# Patient Record
Sex: Female | Born: 1980 | Race: Black or African American | Hispanic: No | Marital: Single | Smoking: Never smoker
Health system: Southern US, Community
[De-identification: ages and names within clinical notes are randomized; demographics above are authoritative.]

## PROBLEM LIST (undated history)

## (undated) DIAGNOSIS — S7410XA Injury of femoral nerve at hip and thigh level, unspecified leg, initial encounter: Secondary | ICD-10-CM

## (undated) DIAGNOSIS — K649 Unspecified hemorrhoids: Secondary | ICD-10-CM

## (undated) DIAGNOSIS — N809 Endometriosis, unspecified: Secondary | ICD-10-CM

## (undated) DIAGNOSIS — K219 Gastro-esophageal reflux disease without esophagitis: Secondary | ICD-10-CM

## (undated) DIAGNOSIS — Z90710 Acquired absence of both cervix and uterus: Secondary | ICD-10-CM

## (undated) DIAGNOSIS — L680 Hirsutism: Secondary | ICD-10-CM

## (undated) DIAGNOSIS — F419 Anxiety disorder, unspecified: Secondary | ICD-10-CM

## (undated) DIAGNOSIS — Z8742 Personal history of other diseases of the female genital tract: Secondary | ICD-10-CM

## (undated) DIAGNOSIS — N319 Neuromuscular dysfunction of bladder, unspecified: Secondary | ICD-10-CM

## (undated) DIAGNOSIS — N644 Mastodynia: Secondary | ICD-10-CM

## (undated) DIAGNOSIS — L709 Acne, unspecified: Secondary | ICD-10-CM

## (undated) HISTORY — DX: Neuromuscular dysfunction of bladder, unspecified: N31.9

## (undated) HISTORY — DX: Endometriosis, unspecified: N80.9

## (undated) HISTORY — DX: Acne, unspecified: L70.9

## (undated) HISTORY — DX: Anxiety disorder, unspecified: F41.9

## (undated) HISTORY — PX: CYST EXCISION: SHX5701

## (undated) HISTORY — DX: Injury of femoral nerve at hip and thigh level, unspecified leg, initial encounter: S74.10XA

## (undated) HISTORY — DX: Unspecified hemorrhoids: K64.9

## (undated) HISTORY — PX: OTHER SURGICAL HISTORY: SHX169

## (undated) HISTORY — DX: Acquired absence of both cervix and uterus: Z90.710

## (undated) HISTORY — DX: Gastro-esophageal reflux disease without esophagitis: K21.9

## (undated) HISTORY — DX: Personal history of other diseases of the female genital tract: Z87.42

## (undated) HISTORY — PX: LIPOSUCTION: SHX10

## (undated) HISTORY — DX: Hirsutism: L68.0

## (undated) HISTORY — DX: Mastodynia: N64.4

---

## 2003-12-30 HISTORY — PX: OTHER SURGICAL HISTORY: SHX169

## 2004-01-19 ENCOUNTER — Other Ambulatory Visit: Payer: Self-pay

## 2007-10-18 ENCOUNTER — Observation Stay: Payer: Self-pay | Admitting: Obstetrics and Gynecology

## 2007-12-29 ENCOUNTER — Observation Stay: Payer: Self-pay

## 2008-01-06 ENCOUNTER — Inpatient Hospital Stay: Payer: Self-pay

## 2008-10-15 ENCOUNTER — Emergency Department: Payer: Self-pay | Admitting: Emergency Medicine

## 2008-10-19 ENCOUNTER — Emergency Department: Payer: Self-pay | Admitting: Emergency Medicine

## 2009-02-16 ENCOUNTER — Ambulatory Visit: Payer: Self-pay | Admitting: Internal Medicine

## 2010-03-05 ENCOUNTER — Ambulatory Visit: Payer: Self-pay | Admitting: Obstetrics and Gynecology

## 2010-03-08 ENCOUNTER — Ambulatory Visit: Payer: Self-pay | Admitting: Obstetrics and Gynecology

## 2010-10-04 ENCOUNTER — Encounter: Admission: RE | Admit: 2010-10-04 | Discharge: 2010-10-04 | Payer: Self-pay | Admitting: Unknown Physician Specialty

## 2011-10-30 HISTORY — PX: LAPAROSCOPY: SHX197

## 2011-11-05 ENCOUNTER — Ambulatory Visit: Payer: Self-pay | Admitting: Obstetrics and Gynecology

## 2011-11-10 ENCOUNTER — Ambulatory Visit: Payer: Self-pay | Admitting: Obstetrics and Gynecology

## 2011-11-13 LAB — PATHOLOGY REPORT

## 2011-12-30 DIAGNOSIS — S7410XA Injury of femoral nerve at hip and thigh level, unspecified leg, initial encounter: Secondary | ICD-10-CM

## 2011-12-30 HISTORY — DX: Injury of femoral nerve at hip and thigh level, unspecified leg, initial encounter: S74.10XA

## 2012-02-17 ENCOUNTER — Ambulatory Visit: Payer: Self-pay | Admitting: Obstetrics and Gynecology

## 2012-02-17 LAB — CBC
HCT: 37.7 % (ref 35.0–47.0)
MCH: 29.9 pg (ref 26.0–34.0)
MCV: 91 fL (ref 80–100)
RBC: 4.16 10*6/uL (ref 3.80–5.20)
WBC: 7.5 10*3/uL (ref 3.6–11.0)

## 2012-02-23 ENCOUNTER — Ambulatory Visit: Payer: Self-pay | Admitting: Obstetrics and Gynecology

## 2012-02-26 LAB — PATHOLOGY REPORT

## 2012-10-18 ENCOUNTER — Ambulatory Visit: Payer: Self-pay | Admitting: Gastroenterology

## 2013-07-09 ENCOUNTER — Emergency Department: Payer: Self-pay | Admitting: Emergency Medicine

## 2014-03-14 LAB — HM PAP SMEAR: HM Pap smear: NEGATIVE

## 2014-11-04 ENCOUNTER — Ambulatory Visit: Payer: Self-pay | Admitting: Gastroenterology

## 2014-11-04 LAB — CLOSTRIDIUM DIFFICILE(ARMC)

## 2014-11-07 LAB — STOOL CULTURE

## 2015-04-16 ENCOUNTER — Ambulatory Visit
Admit: 2015-04-16 | Disposition: A | Discharge status: Home / Self Care | Payer: Self-pay | Attending: Physician Assistant | Admitting: Physician Assistant

## 2015-04-22 NOTE — Consult Note (Signed)
PATIENT NAME:  Tammy Lee, Tammy Lee MR#:  [id number] DATE OF BIRTH:  06-16-1981  DATE OF CONSULTATION:  02/24/2012  REFERRING PHYSICIAN:  Dr. Greggory KeeneFrancesco  CONSULTING PHYSICIAN:  Rose PhiPeter R. Kemper Durielarke, MD  HISTORY: Ms. Tammy Lee is a 34 year old right-handed African American woman with history of endometriosis and chronic pelvic pain who is referred for evaluation of lower extremity numbness and weakness following transvaginal hysterectomy. History comes from the patient, conversation with Dr. Greggory KeeneFrancesco, and her hospital records.   The patient underwent surgery under general endotracheal anesthesia without complication on 02/23/2012. The patient does not recall the period of being in the recovery area, reports feeling  in hospital bed after the procedure, drowsy and dozing for about three hours before awakened by a nurse who came in to give medications through her IV for pain. She reports feeling nauseated and having two bouts of emesis.   After midnight, she rang the nurse to go to the bathroom to urinate. While the nurse was preparing things in the bathroom, the patient sat at the side of the bed and started to stand up, but fell as her legs gave out from weakness, hitting the right knee on the floor and holding to the bed rail until the nurses lifted her onto the bed. She found that she had decreased feeling and numbness and tight feeling of bilateral lower extremities of anterior medial thighs, weakness of her legs with the exception that she had normal feeling and strength in her feet and toes.   Seen the evening of 02/24/2012, she reported that she had had some improvement of movement of the right leg and some improvement of sensation, still had a feeling of numbness of bilateral anterior medial thighs and calves. She denies any prior similar problems. She has not yet had a bowel movement or urge to have a bowel movement.  The patient reports normal urge to urinate and normal urination, but that when sitting on  the commode, her flow would come somewhat in spurts.   PHYSICAL EXAMINATION: The patient is a well developed and well nourished [place] woman who was examined lying initially semisupine, in no apparent distress. She was normocephalic without evidence of trauma and her neck was supple. Mental status was normal as was cranial nerve examination. Motor examination of the upper extremities showed normal tone and symmetric normal strength throughout.   In the lower extremities, there was very mild weakness of hip flexion rated 4+ to 5-/5 bilaterally. Knee extension was rated 4+/5 for the right and  3-/5 on the left. Strength of knees abduction and adduction, knee flexion, foot dorsiflexion and plantarflexion was normal bilaterally. On sensory examination, she reported relative decrease of cold and light touch of inferomedial thighs bilaterally, more so on the left than right. Reflexes were symmetric and rated 2+ in the upper extremities. Reflexes were 2+ bilaterally at the ankles. Patellar reflex 1+ on the right and trace to 1+ on the left. When asked to stand at the side of the bed with balancing assistance, she was able to bear weight, but was not stable to walk.   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   RECOMMENDATIONS:  1. She is referred to physical therapy.  2. Recommend holding at this time on imaging of the pelvis and on electrical studies. I will check on her progress tomorrow.   I appreciate being asked to see this pleasant and interesting lady.  ____________________________ Rose Phi. Kemper Durie, MD prc:bjt D: 02/25/2012 08:54:47 ET T: 02/25/2012 09:44:25 ET JOB#: [id number]  cc: Rose Phi. Kemper Durie, MD, <Dictator> Gaspar Garbe MD ELECTRONICALLY SIGNED 02/26/2012 18:00

## 2015-04-22 NOTE — H&P (Signed)
PATIENT NAME:  Tammy Lee, Tammy Lee MR#:  [id number] DATE OF BIRTH:  25-May-1981  DATE OF ADMISSION:  02/23/2012  PREOPERATIVE DIAGNOSIS: Symptomatic endometriosis.  HISTORY:  Tammy Lee is a 34 year old African American female, para 2-0-1-2, on Microgestin with iron 120 continuously and then 84/7 regimen, presents for surgical management of symptomatic endometriosis. The patient has chronic pain syndrome with perimenstrual low backache, severe dysmenorrhea, and deep thrust dyspareunia. Laparoscopy in November 2012 revealed endometriosis with yellow lesions along the bladder, the anterior abdominal wall, as well as the left uterosacral ligament and left ovarian fossa. The right ovary had single isolated powder-burn implant. The patient desires definitive treatment at this time.   PAST MEDICAL HISTORY:  1. Anxiety. 2. Ovarian cyst.  3. History of ectopic pregnancy, status post left salpingectomy.   PAST SURGICAL HISTORY:  1. Operative laparoscopy with left salpingectomy 2005.  2. Bilateral tubal cautery in 2011. 3. Neck cyst removal in 2000.  4. Laparoscopy with peritoneal biopsies in November 2012.   PAST OB HISTORY: Para 2-0-1-2, spontaneous vaginal delivery times two. Excision of ectopic times one.   FAMILY HISTORY: Negative for cancer of the colon or ovary. There was a history of breast cancer in maternal great grandmother. There was history of heart disease in maternal grandfather. There is a history of diabetes mellitus in maternal grandmother. No history of endometriosis.   SOCIAL HISTORY: The patient does not smoke, does drink alcohol socially. She denies drug use. 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    CURRENT MEDICATIONS:  1. Loestrin-FE 120 daily.  2. Multivitamin daily.  3. Diflucan p.r.n.   DRUG ALLERGIES: None.   REVIEW OF SYSTEMS: The patient denies recent illness. She denies history of coagulopathy. She denies history of reactive airway  disease.   PHYSICAL EXAMINATION:  VITAL SIGNS: Height 5 feet, 2 inches, weight 148, BMI 28, blood pressure 115/74, heart rate 87.  GENERAL:  The patient is a pleasant and well-appearing African American female in no acute distress. She is alert and oriented.   HEENT:  Oropharynx is clear.   NECK: Supple. There is no thyromegaly or adenopathy.   LUNGS: Clear.   HEART: Regular rate and rhythm without murmur.   ABDOMEN: Soft, nontender. No organomegaly.  Laparoscopy incisions are well approximated and healed. No hernias.    PELVIC: Normal external genitalia. BUS normal. Vagina has good estrogen effect. Cervix is parous and slightly barrel-shaped. There is mild cervical motion tenderness. Uterus is retroverted, mobile, and tender, two out of four. No adnexal masses or tenderness are appreciated. It is not enlarged.   RECTAL: External rectal exam is normal.   EXTREMITIES: Without clubbing, cyanosis, or edema.   SKIN: Normal  MUSCULOSKELETAL:  Normal.  NEUROLOGIC: Normal.   IMPRESSION:  Symptomatic endometriosis.   PLAN: Transvaginal hysterectomy. Date of surgery is 02/23/2012.   CONSENT NOTE: Tammy EvensCandace Lee is to undergo TVH for symptomatic endometriosis. She is understanding of the planned procedures and is aware of and accepting of all surgical risks which include but are not limited to bleeding, infection, pelvic organ injury with need for repair, blood clot disorders, anesthesia risks, and death. The patient has had all questions answered. Consent is given.     ____________________________ Prentice DockerMartin A. Javaris Wigington, MD mad:bjt D: 02/17/2012 09:26:08 ET T: 02/17/2012 09:49:12 ET JOB#: [id number]  cc: Daphine DeutscherMartin A. [place], MD, <Dictator> Prentice DockerMARTIN A Kamyia Thomason MD ELECTRONICALLY SIGNED 02/23/2012 16:03

## 2015-04-22 NOTE — Op Note (Signed)
PATIENT NAME:  Tammy Lee, Tammy Lee DATE OF BIRTH:  1981/04/13  DATE OF PROCEDURE:  02/23/2012  PREOPERATIVE DIAGNOSES:  1. Chronic pelvic pain.  2. Endometriosis.   POSTOPERATIVE DIAGNOSES: 1. Chronic pelvic pain. 2. Endometriosis.  OPERATIVE PROCEDURE: Transvaginal hysterectomy.   SURGEON: Herold HarmsMartin A Elfie Costanza, M.D.   FIRST ASSISTANT:  Dr. Cassell ClementLynde Knowles-Jonas   ANESTHESIA: General endotracheal.   INDICATIONS: The patient is a 34 year old African American female, para 2-0-1-2, on Microgestin with iron 120 continuously, who presents for surgical management of symptomatic endometriosis. The patient has chronic pain syndrome with perimenstrual low backache, severe dysmenorrhea, and deep thrust dyspareunia. Laparoscopy in 2012 in November revealed endometriosis throughout the bladder flap, anterior abdominal wall, uterosacral ligaments, and left ovarian fossa.   FINDINGS AT SURGERY: A gynecoid pelvis. The uterus was midplane and of normal size and shape. It was grossly normal. The right ovary demonstrated a 3-cm simple cyst.  The left ovary was normal.   DESCRIPTION OF PROCEDURE: The patient was brought to the operating room where she was placed in the supine position. General endotracheal anesthesia was induced without difficulty. She was placed in the dorsal lithotomy position using candy-cane stirrups. A Betadine perineal intravaginal prep and drape was performed in the standard fashion. A Foley catheter was placed and was draining clear yellow urine from the bladder. A weighted speculum was placed into the vagina. A double-tooth tenaculum was placed on the cervix. Posterior colpotomy was made with Mayo scissors. The uterosacral ligaments were clamped, cut, and stick tied using 0 Vicryl. The cervix was circumscribed with a scalpel and the bladder flap was dissected off the lower uterine segment through sharp and blunt dissection. Sequentially the cardinal/broad ligament complexes  were clamped, cut, and stick tied up to the level of the uteroovarian ligaments. At this point each pedicle was crossclamped and cut and the uterus was removed from the operative field. These pedicles were tied off using a suture ligature technique. Good hemostasis was noted. The ovaries were noted to be normal with a simple ovarian cyst of 3 cm being noted on the right. The posterior cuff was closed using a running locking stitch of 0 chromic. The vagina was then closed with simple interrupted sutures of 2-0 chromic. Good hemostasis was noted. Upon completion of the procedure, all instrumentation was removed from the vagina. The patient was then mobilized, awakened, and taken to the recovery room in satisfactory condition.  Estimated blood loss was 150 mL. IV fluids were 1400 mL. Urine output was clear, not calculated.     ____________________________ Prentice DockerMartin A. Efraim Vanallen, MD mad:bjt D: 02/23/2012 14:22:42 ET T: 02/23/2012 14:46:07 ET JOB#: [id number]  cc: Daphine DeutscherMartin A. Westlynn Fifer, MD, <Dictator> Beryle QuantLynde G. Toya SmothersKnowles-Jonas, MD Prentice DockerMARTIN A Rice Walsh MD ELECTRONICALLY SIGNED 02/23/2012 16:05

## 2015-04-26 ENCOUNTER — Other Ambulatory Visit: Payer: Self-pay | Admitting: Neurology

## 2015-04-26 DIAGNOSIS — R42 Dizziness and giddiness: Secondary | ICD-10-CM

## 2015-05-03 ENCOUNTER — Ambulatory Visit: Payer: Self-pay

## 2015-05-03 ENCOUNTER — Ambulatory Visit
Admission: RE | Admit: 2015-05-03 | Discharge: 2015-05-03 | Disposition: A | Discharge status: Home / Self Care | Payer: BLUE CROSS/BLUE SHIELD | Source: Ambulatory Visit | Attending: Neurology | Admitting: Neurology

## 2015-05-03 DIAGNOSIS — R42 Dizziness and giddiness: Secondary | ICD-10-CM | POA: Insufficient documentation

## 2015-05-03 DIAGNOSIS — R55 Syncope and collapse: Secondary | ICD-10-CM | POA: Insufficient documentation

## 2015-05-03 MED ORDER — GADOBENATE DIMEGLUMINE 529 MG/ML IV SOLN
15.0000 mL | Freq: Once | INTRAVENOUS | Status: AC | PRN
  Filled 2015-05-03: qty 15

## 2015-05-24 DIAGNOSIS — R519 Headache, unspecified: Secondary | ICD-10-CM | POA: Insufficient documentation

## 2015-05-24 DIAGNOSIS — R51 Headache: Secondary | ICD-10-CM

## 2015-05-26 DIAGNOSIS — G43809 Other migraine, not intractable, without status migrainosus: Secondary | ICD-10-CM | POA: Insufficient documentation

## 2015-10-17 ENCOUNTER — Telehealth: Payer: Self-pay | Admitting: Obstetrics and Gynecology

## 2015-10-17 NOTE — Telephone Encounter (Signed)
PT CALLED AND WAS TOLD THAT SHE HAS HSV AND SHE HASN'T HAD ANY SYMPTOMS, SO SHE WANTED TO KNOW IF SOMETHING COULD BE CALLED IN FOR HER, CAN YOU SEND RX TO [facility]. SHE WOULD LIKE A CALL BACK.

## 2015-10-17 NOTE — Telephone Encounter (Signed)
Pt states she has a bump on the vagina and thinks its hsv. Advised appt- appt made for 10/20- 7:30.

## 2015-10-18 ENCOUNTER — Encounter: Payer: Self-pay | Admitting: Obstetrics and Gynecology

## 2015-10-18 ENCOUNTER — Ambulatory Visit (INDEPENDENT_AMBULATORY_CARE_PROVIDER_SITE_OTHER): Payer: BLUE CROSS/BLUE SHIELD | Admitting: Obstetrics and Gynecology

## 2015-10-18 VITALS — BP 127/90 | HR 76 | Ht 63.0 in | Wt 158.0 lb

## 2015-10-18 DIAGNOSIS — N83209 Unspecified ovarian cyst, unspecified side: Secondary | ICD-10-CM | POA: Insufficient documentation

## 2015-10-18 DIAGNOSIS — Z8759 Personal history of other complications of pregnancy, childbirth and the puerperium: Secondary | ICD-10-CM | POA: Insufficient documentation

## 2015-10-18 DIAGNOSIS — N809 Endometriosis, unspecified: Secondary | ICD-10-CM

## 2015-10-18 DIAGNOSIS — L680 Hirsutism: Secondary | ICD-10-CM

## 2015-10-18 DIAGNOSIS — F419 Anxiety disorder, unspecified: Secondary | ICD-10-CM | POA: Insufficient documentation

## 2015-10-18 DIAGNOSIS — K219 Gastro-esophageal reflux disease without esophagitis: Secondary | ICD-10-CM

## 2015-10-18 MED ORDER — ACYCLOVIR 400 MG PO TABS: 400.0000 mg | ORAL_TABLET | Freq: Three times a day (TID) | ORAL | Status: DC

## 2015-10-18 NOTE — Patient Instructions (Signed)
1.  Acyclovir 400 mg 3 times a day for 5 days.  6.  Refills given. 2.  Follow up as needed. 3.  Discontinue Flagyl and oral supplements at this time while taking Benadryl 25-50 mg every 6 hours.  Over the next 24 hours

## 2015-10-18 NOTE — Progress Notes (Signed)
Patient ID: Tammy Lee, female   DOB: Apr 06, 1981, 34 y.o.   MRN: [id number] Pt presents for bump on vulva. Also c/o allergic reaction to medication, unsure exactly which one. Was put on Flagyl, Metrozol for BV. Lip swelling, neck itching. Taking Benadryl x2 with results this am. Also taking suppl of oregano.  Chief complaint: 1.  Vulvitis symptoms. 2.  History of HSV-2 positive.  Patient presents today for evaluation of possible acute onset of HSV-2.  Patient states that she has had itching, burning and discomfort in the vulvar region and felt A" bump" at the introitus.  She has not had any obvious ulceration. Patient has been treated for BV with Flagyl and has developed a probable allergic reaction with swelling.  She has taken Benadryl for this condition.  Past Medical History  Diagnosis Date  . Bladder dysfunction   . GERD (gastroesophageal reflux disease)   . Hemorrhoid   . Endometriosis   . Acne   . Anxiety   . History of ovarian cyst   . 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 Mastodynia   . Status post vaginal hysterectomy   . Hirsutism    Past Surgical History  Procedure Laterality Date  . Tvh      02/23/2012  . Bilateral tubal catery    . Laparoscopy  10/2011    WITH EXCISION AND FULGURATION OF ENDOMETRIOSIS  . Operative l/s with left salpingectomy  2005    Ectopic    Review of systems: Per HPI.  OBJECTIVE: BP 127/90 mmHg  Pulse 76  Ht 5\' 3"  (1.6 m)  Wt 158 lb (71.668 kg)  BMI 28.00 kg/m2 Postoperative hernia from her female no acute distress.  HEENT exam lower lip swelling noted. Lungs-no stridor, regular rate  Pelvic: External genitalia normal; mild sensitivity to palpation of the introitus and posterior fourchette without obvious ulceration, epithelial skin breakdown, or discharge.  No malodor. BUS-normal  IMPRESSION: 1.  HSV-2 prodromal symptoms and patient with history of positive HSV 2 antibody .  PLAN: 1.  Acyclovir 400 mg 3 times a day  for 5 days. Refill 6. 2.  Return when necessary  A total of 15 minutes were spent face-to-face with the patient during this encounter and over half of that time dealt with counseling and coordination of care.  Herold HarmsMartin A Defrancesco, MD  Note: This dictation was prepared with Dragon dictation along with smaller phrase technology. Any transcriptional errors that result from this process are unintentional.

## 2016-03-25 ENCOUNTER — Ambulatory Visit (INDEPENDENT_AMBULATORY_CARE_PROVIDER_SITE_OTHER): Payer: Managed Care, Other (non HMO) | Admitting: Obstetrics and Gynecology

## 2016-03-25 ENCOUNTER — Encounter: Payer: Self-pay | Admitting: Obstetrics and Gynecology

## 2016-03-25 VITALS — BP 115/73 | HR 80 | Ht 63.0 in | Wt 162.6 lb

## 2016-03-25 DIAGNOSIS — Z0000 Encounter for general adult medical examination without abnormal findings: Secondary | ICD-10-CM | POA: Diagnosis not present

## 2016-03-25 DIAGNOSIS — Z202 Contact with and (suspected) exposure to infections with a predominantly sexual mode of transmission: Secondary | ICD-10-CM | POA: Diagnosis not present

## 2016-03-25 DIAGNOSIS — Z90710 Acquired absence of both cervix and uterus: Secondary | ICD-10-CM | POA: Insufficient documentation

## 2016-03-25 DIAGNOSIS — Z01419 Encounter for gynecological examination (general) (routine) without abnormal findings: Secondary | ICD-10-CM

## 2016-03-25 DIAGNOSIS — N809 Endometriosis, unspecified: Secondary | ICD-10-CM

## 2016-03-25 NOTE — Patient Instructions (Signed)
1. No Pap smear needed. 2. Continue self breast exam 3. Maintain healthy eating and exercise with a goal of 7 pound weight loss over the year 4. Consider Depo-Provera injection as a treatment for symptomatic endometriosis. Other options were discussed and not desired (Depo-Lupron, danazol) 5. Return in 1 year for annual exam

## 2016-03-25 NOTE — Progress Notes (Signed)
Patient ID: Tammy Lee, female   DOB: [birth year], 35 y.o.   MRN: [id number] ANNUAL PREVENTATIVE CARE GYN  ENCOUNTER NOTE  Subjective:       Tammy Lee is a 35 y.o. No obstetric history on file. female here for a routine annual gynecologic exam.  Current complaints: 1. Std testing  2. Endometriosis  Endometriosis pain in right lower quadrant/pelvis is stable. Dyspareunia, present, without major changes Patient reports that her femoral nerve neuropraxia is stable. Bowel and bladder function are normal.   Gynecologic History No LMP recorded. Patient has had a hysterectomy. Contraception: status post hysterectomy Last Pap: 02/2014 neg/neg. Results were: normal Last mammogram: n/a. Results were: n/a  Obstetric History OB History  No data available    Past Medical History  Diagnosis Date  . Bladder dysfunction   . GERD (gastroesophageal reflux disease)   . Hemorrhoid   . Endometriosis   . Acne   . Anxiety   . History of ovarian cyst   . 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 Mastodynia   . Status post vaginal hysterectomy   . Hirsutism     Past Surgical History  Procedure Laterality Date  . Tvh      02/23/2012  . Bilateral tubal catery    . Laparoscopy  10/2011    WITH EXCISION AND FULGURATION OF ENDOMETRIOSIS  . Operative l/s with left salpingectomy  2005    Ectopic    Current Outpatient Prescriptions on File Prior to Visit  Medication Sig Dispense Refill  . acyclovir (ZOVIRAX) 400 MG tablet Take 1 tablet (400 mg total) by mouth 3 (three) times daily. 15 tablet 6  . omeprazole (PRILOSEC) 20 MG capsule Take 20 mg by mouth daily.     No current facility-administered medications on file prior to visit.    Allergies  Allergen Reactions  . Citalopram Other (See Comments)  . Topiramate Other (See Comments)    sleepy  . Augmentin [Amoxicillin-Pot Clavulanate] Rash    Social History   Social History  . Marital Status: Single    Spouse Name: N/A   . Number of Children: N/A  . Years of Education: N/A   Occupational History  . Not on file.   Social History Main Topics  . Smoking status: Never Smoker   . Smokeless tobacco: Not on file  . Alcohol Use: Yes     Comment: occas  . Drug Use: No  . Sexual Activity: Yes    Birth Control/ Protection: Surgical     Comment: tvh   Other Topics Concern  . Not on file   Social History Narrative    Family History  Problem Relation Age of Onset  . Cancer Neg Hx   . Diabetes Neg Hx   . Heart disease Neg Hx     The following portions of the patient's history were reviewed and updated as appropriate: allergies, current medications, past family history, past medical history, past social history, past surgical history and problem list.  Review of Systems ROS Review of Systems - General ROS: negative for - chills, fatigue, fever, hot flashes, night sweats, weight gain or weight loss Psychological ROS: negative for - anxiety, decreased libido, depression, mood swings, physical abuse or sexual abuse Ophthalmic ROS: negative for - blurry vision, eye pain or loss of vision ENT ROS: negative for - headaches, hearing change, visual changes or vocal changes Allergy and Immunology ROS: negative for - hives, itchy/watery eyes or seasonal allergies Hematological  and Lymphatic ROS: negative for - bleeding problems, bruising, swollen lymph nodes or weight loss Endocrine ROS: negative for - galactorrhea, hair pattern changes, hot flashes, malaise/lethargy, mood swings, palpitations, polydipsia/polyuria, skin changes, temperature intolerance or unexpected weight changes Breast ROS: negative for - new or changing breast lumps or nipple discharge Respiratory ROS: negative for - cough or shortness of breath Cardiovascular ROS: negative for - chest pain, irregular heartbeat, palpitations or shortness of breath Gastrointestinal ROS: no abdominal pain, change in bowel habits, or black or bloody  stools Genito-Urinary ROS: no dysuria, trouble voiding, or hematuria Musculoskeletal ROS: negative for - joint pain or joint stiffness Neurological ROS: negative for - bowel and bladder control changes Dermatological ROS: negative for rash and skin lesion changes   Objective:   BP 115/73 mmHg  Pulse 80  Ht 5\' 3"  (1.6 m)  Wt 162 lb 9.6 oz (73.755 kg)  BMI 28.81 kg/m2 CONSTITUTIONAL: Well-developed, well-nourished female in no acute distress.  PSYCHIATRIC: Normal mood and affect. Normal behavior. Normal judgment and thought content. NEUROLGIC: Alert and oriented to person, place, and time. Normal muscle tone coordination. No cranial nerve deficit noted. HENT:  Normocephalic, atraumatic, External right and left ear normal. Oropharynx is clear and moist EYES: Conjunctivae and EOM are normal. Pupils are equal, round, and reactive to light. No scleral icterus.  NECK: Normal range of motion, supple, no masses.  Normal thyroid.  SKIN: Skin is warm and dry. No rash noted. Not diaphoretic. No erythema. No pallor. CARDIOVASCULAR: Normal heart rate noted, regular rhythm, no murmur. RESPIRATORY: Clear to auscultation bilaterally. Effort and breath sounds normal, no problems with respiration noted. BREASTS: Symmetric in size. No masses, skin changes, nipple drainage, or lymphadenopathy. ABDOMEN: Soft, normal bowel sounds, no distention noted.  No tenderness, rebound or guarding.  BLADDER: Normal PELVIC:  External Genitalia: Normal  BUS: Normal  Vagina: Normal; good vault support; cuff intact without nodularity or tenderness  Cervix: Surgically absent  Uterus: Surgically absent  Adnexa: Normal; nonpalpable and nontender  RV: External Exam NormaI  MUSCULOSKELETAL: Normal range of motion. No tenderness.  No cyanosis, clubbing, or edema.  2+ distal pulses. LYMPHATIC: No Axillary, Supraclavicular, or Inguinal Adenopathy.    Assessment:   Annual gynecologic examination 35 y.o. Contraception:  status post hysterectomy TVH bmi-28 Endometriosis, stable Femoral nerve neuropraxia stable  Plan:  Pap: GC/CT NAAT Mammogram: Not Indicated Stool Guaiac Testing:  Not Indicated Labs: std testing - routine labs thru pcp Routine preventative health maintenance measures emphasized: Exercise/Diet/Weight control, Tobacco Warnings and Alcohol/Substance use risks Consider Depo-Provera as the treatment for endometriosis if desired Return to Clinic - 1 Year   [place] Land'OrMiller, CMA  Herold HarmsMartin A Gini Caputo, MD  Note: This dictation was prepared with Dragon dictation along with smaller phrase technology. Any transcriptional errors that result from this process are unintentional.

## 2016-03-26 LAB — GC/CHLAMYDIA PROBE AMP
Chlamydia trachomatis, NAA: NEGATIVE
Neisseria gonorrhoeae by PCR: NEGATIVE

## 2016-03-26 LAB — HEPATITIS B SURFACE ANTIGEN: Hepatitis B Surface Ag: NEGATIVE

## 2016-03-26 LAB — HIV ANTIBODY (ROUTINE TESTING W REFLEX): HIV Screen 4th Generation wRfx: NONREACTIVE

## 2016-03-26 LAB — HEPATITIS C ANTIBODY: Hep C Virus Ab: <0.1 s/co ratio (ref 0.0–0.9)

## 2016-03-26 LAB — RPR: RPR Ser Ql: NONREACTIVE

## 2016-04-07 DIAGNOSIS — E559 Vitamin D deficiency, unspecified: Secondary | ICD-10-CM | POA: Insufficient documentation

## 2017-01-01 ENCOUNTER — Telehealth: Payer: Self-pay | Admitting: Obstetrics and Gynecology

## 2017-01-01 MED ORDER — METRONIDAZOLE 0.75 % VA GEL: 1.0000 | Freq: Every day | VAGINAL | 0 refills | Status: DC

## 2017-01-01 NOTE — Telephone Encounter (Signed)
Pt is feeling some vaginal irritation and wants to know if you could call something in or does she need to come in

## 2017-01-01 NOTE — Telephone Encounter (Signed)
Pt states she thinks she has BV. She has had this in the past. Pos odor and slight d/c. NO new sex partners. Advised I will send in metrogel. If no better after 7 days, she will need to be seen. Pt voices understanding.

## 2017-02-21 ENCOUNTER — Other Ambulatory Visit: Payer: Self-pay | Admitting: Obstetrics and Gynecology

## 2017-02-23 DIAGNOSIS — R200 Anesthesia of skin: Secondary | ICD-10-CM | POA: Insufficient documentation

## 2017-03-30 NOTE — Progress Notes (Signed)
Patient ID: Tammy Lee, female   DOB: 09-29-1981, 36 y.o.   MRN: [id number] ANNUAL PREVENTATIVE CARE GYN  ENCOUNTER NOTE  Subjective:       Tammy Lee is a 36 y.o. G 3 P2012. female here for a routine annual gynecologic exam.  Current complaints: 1. Std testing  2. Endometriosis  No significant issues with bowel or bladder function  Endometriosis pain in right lower quadrant/pelvis is stable. 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 Patient has had a hysterectomy. Status post TVH Contraception: status post hysterectomy Last Pap: 02/2014 neg/neg. Results were: normal Last mammogram: n/a. Results were: n/a  Obstetric History OB History  No data available    Past Medical History:  Diagnosis Date  . Acne   . Anxiety   . Bladder dysfunction   . Endometriosis   . GERD (gastroesophageal reflux disease)   . Hemorrhoid   . Hirsutism   . History of ovarian cyst   . 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 Mastodynia   . Status post vaginal hysterectomy     Past Surgical History:  Procedure Laterality Date  . BILATERAL TUBAL CATERY    . LAPAROSCOPY  10/2011   WITH EXCISION AND FULGURATION OF ENDOMETRIOSIS  . operative L/S with Left Salpingectomy  2005   Ectopic  . [facility]     02/23/2012    Current Outpatient Prescriptions on File Prior to Visit  Medication Sig Dispense Refill  . acyclovir (ZOVIRAX) 400 MG tablet TAKE 1 TABLET(400 MG) BY MOUTH THREE TIMES DAILY 15 tablet 1  . metroNIDAZOLE (METROGEL VAGINAL) 0.75 % vaginal gel Place 1 Applicatorful vaginally at bedtime. For seven nights 70 g 0  . omeprazole (PRILOSEC) 20 MG capsule Take 20 mg by mouth daily.     No current facility-administered medications on file prior to visit.      Allergies  Allergen Reactions  . Citalopram Other (See Comments)  . Topiramate Other (See Comments)    sleepy  . Augmentin [Amoxicillin-Pot Clavulanate] Rash    Social History   Social History  . Marital status: Single    Spouse name: N/A  . Number of children: N/A  . Years of education: N/A   Occupational History  . Not on file.   Social History Main Topics  . Smoking status: Never Smoker  . Smokeless tobacco: Not on file  . Alcohol use Yes     Comment: occas  . Drug use: No  . Sexual activity: Yes    Birth control/ protection: Surgical     Comment: tvh   Other Topics Concern  . Not on file   Social History Narrative  . No narrative on file    Family History  Problem Relation Age of Onset  . Cancer Neg Hx   . Diabetes Neg Hx   . Heart disease Neg Hx     The following portions of the patient's history were reviewed and updated as appropriate: allergies, current medications, past family history, past medical history, past social history, past surgical history and problem list.  Review of Systems Review of Systems  Constitutional: Negative for chills, fever and weight loss.  HENT: Negative.   Eyes: Negative.   Respiratory: Negative.   Cardiovascular: Negative.   Gastrointestinal: Negative.   Genitourinary: Negative.   Musculoskeletal:  Negative.   Skin: Negative.   Neurological: Negative.  Negative for weakness.  Endo/Heme/Allergies: Negative.   Psychiatric/Behavioral: Negative.     Objective:   BP 118/72   Pulse 91   Ht  (1.6 m)   Wt 157 lb 14.4 oz (71.6 kg)   BMI 27.97 kg/m  CONSTITUTIONAL: Well-developed, well-nourished female in no acute distress.  PSYCHIATRIC: Normal mood and affect. Normal behavior. Normal judgment and thought content. NEUROLGIC: Alert and oriented to person, place, and time. Normal muscle tone coordination. No cranial nerve deficit noted. HENT:  Normocephalic, atraumatic, External right and left ear normal.  Oropharynx is clear and moist EYES: Conjunctivae and EOM are normal. No scleral icterus.  NECK: Normal range of motion, supple, no masses.  Normal thyroid.  SKIN: Skin is warm and dry. No rash noted. Not diaphoretic. No erythema. No pallor. CARDIOVASCULAR: Normal heart rate noted, regular rhythm, no murmur. RESPIRATORY: Clear to auscultation bilaterally. Effort and breath sounds normal, no problems with respiration noted. BREASTS: Symmetric in size. No masses, skin changes, nipple drainage, or lymphadenopathy. ABDOMEN: Soft, normal bowel sounds, no distention noted.  No tenderness, rebound or guarding.  BLADDER: Normal PELVIC:  External Genitalia: Normal  BUS: Normal  Vagina: Normal; good vault support; cuff intact without nodularity or tenderness; white secretions and vaginal vault  Cervix: Surgically absent  Uterus: Surgically absent  Adnexa: Normal; nonpalpable and nontender  RV: External Exam NormaI  MUSCULOSKELETAL: Normal range of motion. No tenderness.  No cyanosis, clubbing, or edema.  LYMPHATIC: No Axillary, Supraclavicular, or Inguinal Adenopathy.    Assessment:   Annual gynecologic examination 36 y.o. Contraception: status post hysterectomy TVH [id number] Endometriosis, stable Femoral nerve neuropraxia stable  Plan:  Pap: GC/CT NAAT Mammogram: Not Indicated Stool Guaiac Testing:  Not Indicated Labs: std testing - routine labs thru pcp Routine preventative health maintenance measures emphasized: Exercise/Diet/Weight control, Tobacco Warnings and Alcohol/Substance use risks Return to Clinic - 1 Year   Crystal Plato, CMA  Herold Harms, MD   Note: This dictation was prepared with Dragon dictation along with smaller phrase technology. Any transcriptional errors that result from this process are unintentional.

## 2017-03-31 ENCOUNTER — Ambulatory Visit (INDEPENDENT_AMBULATORY_CARE_PROVIDER_SITE_OTHER): Payer: Managed Care, Other (non HMO) | Admitting: Obstetrics and Gynecology

## 2017-03-31 ENCOUNTER — Encounter: Payer: Self-pay | Admitting: Obstetrics and Gynecology

## 2017-03-31 VITALS — BP 118/72 | HR 91 | Ht 63.0 in | Wt 157.9 lb

## 2017-03-31 DIAGNOSIS — Z01419 Encounter for gynecological examination (general) (routine) without abnormal findings: Secondary | ICD-10-CM

## 2017-03-31 DIAGNOSIS — Z202 Contact with and (suspected) exposure to infections with a predominantly sexual mode of transmission: Secondary | ICD-10-CM

## 2017-03-31 DIAGNOSIS — N809 Endometriosis, unspecified: Secondary | ICD-10-CM | POA: Diagnosis not present

## 2017-03-31 DIAGNOSIS — Z90710 Acquired absence of both cervix and uterus: Secondary | ICD-10-CM | POA: Diagnosis not present

## 2017-03-31 DIAGNOSIS — L680 Hirsutism: Secondary | ICD-10-CM | POA: Diagnosis not present

## 2017-03-31 DIAGNOSIS — K219 Gastro-esophageal reflux disease without esophagitis: Secondary | ICD-10-CM | POA: Diagnosis not present

## 2017-03-31 MED ORDER — ACYCLOVIR 400 MG PO TABS
400.0000 mg | ORAL_TABLET | Freq: Three times a day (TID) | ORAL | 4 refills | Status: DC
Start: 2017-03-31 — End: 2020-02-02

## 2017-03-31 NOTE — Patient Instructions (Signed)
1. No Pap smear needed 2. Self breast awareness is encouraged 3. STD screening is done today 4. Screening labs for routine wellness are to be obtained through primary care 5. Continue with healthy eating and exercise and controlled weight loss 6. Return in 1 year for physical  Health Maintenance, Female Adopting a healthy lifestyle and getting preventive care can go a long way to promote health and wellness. Talk with your health care provider about what schedule of regular examinations is right for you. This is a good chance for you to check in with your provider about disease prevention and staying healthy. In between checkups, there are plenty of things you can do on your own. Experts have done a lot of research about which lifestyle changes and preventive measures are most likely to keep you healthy. Ask your health care provider for more information. Weight and diet Eat a healthy diet  Be sure to include plenty of vegetables, fruits, low-fat dairy products, and lean protein.  Do not eat a lot of foods high in solid fats, added sugars, or salt.  Get regular exercise. This is one of the most important things you can do for your health.  Most adults should exercise for at least 150 minutes each week. The exercise should increase your heart rate and make you sweat (moderate-intensity exercise).  Most adults should also do strengthening exercises at least twice a week. This is in addition to the moderate-intensity exercise. Maintain a healthy weight  Body mass index (BMI) is a measurement that can be used to identify possible weight problems. It estimates body fat based on height and weight. Your health care provider can help determine your BMI and help you achieve or maintain a healthy weight.  For females 62 years of age and older:  A BMI below 18.5 is considered underweight.  A BMI of 18.5 to 24.9 is normal.  A BMI of 25 to 29.9 is considered overweight.  A BMI of 30 and above is  considered obese. Watch levels of cholesterol and blood lipids  You should start having your blood tested for lipids and cholesterol at 38 years of age, then have this test every 5 years.  You may need to have your cholesterol levels checked more often if:  Your lipid or cholesterol levels are high.  You are older than 38 years of age.  You are at high risk for heart disease. Cancer screening Lung Cancer  Lung cancer screening is recommended for adults 61-39 years old who are at high risk for lung cancer because of a history of smoking.  A yearly low-dose CT scan of the lungs is recommended for people who:  Currently smoke.  Have quit within the past 15 years.  Have at least a 30-pack-year history of smoking. A pack year is smoking an average of one pack of cigarettes a day for 1 year.  Yearly screening should continue until it has been 15 years since you quit.  Yearly screening should stop if you develop a health problem that would prevent you from having lung cancer treatment. Breast Cancer  Practice breast self-awareness. This means understanding how your breasts normally appear and feel.  It also means doing regular breast self-exams. Let your health care provider know about any changes, no matter how small.  If you are in your 20s or 30s, you should have a clinical breast exam (CBE) by a health care provider every 1-3 years as part of a regular health exam.  If  you are 40 or older, have a CBE every year. Also consider having a breast X-ray (mammogram) every year.  If you have a family history of breast cancer, talk to your health care provider about genetic screening.  If you are at high risk for breast cancer, talk to your health care provider about having an MRI and a mammogram every year.  Breast cancer gene (BRCA) assessment is recommended for women who have family members with BRCA-related cancers. BRCA-related cancers  include:  Breast.  Ovarian.  Tubal.  Peritoneal cancers.  Results of the assessment will determine the need for genetic counseling and BRCA1 and BRCA2 testing. Cervical Cancer  Your health care provider may recommend that you be screened regularly for cancer of the pelvic organs (ovaries, uterus, and vagina). This screening involves a pelvic examination, including checking for microscopic changes to the surface of your cervix (Pap test). You may be encouraged to have this screening done every 3 years, beginning at age 57.  For women ages 7-65, health care providers may recommend pelvic exams and Pap testing every 3 years, or they may recommend the Pap and pelvic exam, combined with testing for human papilloma virus (HPV), every 5 years. Some types of HPV increase your risk of cervical cancer. Testing for HPV may also be done on women of any age with unclear Pap test results.  Other health care providers may not recommend any screening for nonpregnant women who are considered low risk for pelvic cancer and who do not have symptoms. Ask your health care provider if a screening pelvic exam is right for you.  If you have had past treatment for cervical cancer or a condition that could lead to cancer, you need Pap tests and screening for cancer for at least 20 years after your treatment. If Pap tests have been discontinued, your risk factors (such as having a new sexual partner) need to be reassessed to determine if screening should resume. Some women have medical problems that increase the chance of getting cervical cancer. In these cases, your health care provider may recommend more frequent screening and Pap tests. Colorectal Cancer  This type of cancer can be detected and often prevented.  Routine colorectal cancer screening usually begins at 38 years of age and continues through 38 years of age.  Your health care provider may recommend screening at an earlier age if you have risk factors  for colon cancer.  Your health care provider may also recommend using home test kits to check for hidden blood in the stool.  A small camera at the end of a tube can be used to examine your colon directly (sigmoidoscopy or colonoscopy). This is done to check for the earliest forms of colorectal cancer.  Routine screening usually begins at age 13.  Direct examination of the colon should be repeated every 5-10 years through 38 years of age. However, you may need to be screened more often if early forms of precancerous polyps or small growths are found. Skin Cancer  Check your skin from head to toe regularly.  Tell your health care provider about any new moles or changes in moles, especially if there is a change in a mole's shape or color.  Also tell your health care provider if you have a mole that is larger than the size of a pencil eraser.  Always use sunscreen. Apply sunscreen liberally and repeatedly throughout the day.  Protect yourself by wearing long sleeves, pants, a wide-brimmed hat, and sunglasses whenever you  are outside. Heart disease, diabetes, and high blood pressure  High blood pressure causes heart disease and increases the risk of stroke. High blood pressure is more likely to develop in:  People who have blood pressure in the high end of the normal range (130-139/85-89 mm Hg).  People who are overweight or obese.  People who are African American.  If you are 62-43 years of age, have your blood pressure checked every 3-5 years. If you are 59 years of age or older, have your blood pressure checked every year. You should have your blood pressure measured twice-once when you are at a hospital or clinic, and once when you are not at a hospital or clinic. Record the average of the two measurements. To check your blood pressure when you are not at a hospital or clinic, you can use:  An automated blood pressure machine at a pharmacy.  A home blood pressure monitor.  If you  are between 87 years and 42 years old, ask your health care provider if you should take aspirin to prevent strokes.  Have regular diabetes screenings. This involves taking a blood sample to check your fasting blood sugar level.  If you are at a normal weight and have a low risk for diabetes, have this test once every three years after 38 years of age.  If you are overweight and have a high risk for diabetes, consider being tested at a younger age or more often. Preventing infection Hepatitis B  If you have a higher risk for hepatitis B, you should be screened for this virus. You are considered at high risk for hepatitis B if:  You were born in a country where hepatitis B is common. Ask your health care provider which countries are considered high risk.  Your parents were born in a high-risk country, and you have not been immunized against hepatitis B (hepatitis B vaccine).  You have HIV or AIDS.  You use needles to inject street drugs.  You live with someone who has hepatitis B.  You have had sex with someone who has hepatitis B.  You get hemodialysis treatment.  You take certain medicines for conditions, including cancer, organ transplantation, and autoimmune conditions. Hepatitis C  Blood testing is recommended for:  Everyone born from [birth year] through 1967.  Anyone with known risk factors for hepatitis C. Sexually transmitted infections (STIs)  You should be screened for sexually transmitted infections (STIs) including gonorrhea and chlamydia if:  You are sexually active and are younger than 38 years of age.  You are older than 38 years of age and your health care provider tells you that you are at risk for this type of infection.  Your sexual activity has changed since you were last screened and you are at an increased risk for chlamydia or gonorrhea. Ask your health care provider if you are at risk.  If you do not have HIV, but are at risk, it may be recommended that you  take a prescription medicine daily to prevent HIV infection. This is called pre-exposure prophylaxis (PrEP). You are considered at risk if:  You are sexually active and do not regularly use condoms or know the HIV status of your partner(s).  You take drugs by injection.  You are sexually active with a partner who has HIV. Talk with your health care provider about whether you are at high risk of being infected with HIV. If you choose to begin PrEP, you should first be tested for HIV. You  should then be tested every 3 months for as long as you are taking PrEP. Pregnancy  If you are premenopausal and you may become pregnant, ask your health care provider about preconception counseling.  If you may become pregnant, take 400 to 800 micrograms (mcg) of folic acid every day.  If you want to prevent pregnancy, talk to your health care provider about birth control (contraception). Osteoporosis and menopause  Osteoporosis is a disease in which the bones lose minerals and strength with aging. This can result in serious bone fractures. Your risk for osteoporosis can be identified using a bone density scan.  If you are 72 years of age or older, or if you are at risk for osteoporosis and fractures, ask your health care provider if you should be screened.  Ask your health care provider whether you should take a calcium or vitamin D supplement to lower your risk for osteoporosis.  Menopause may have certain physical symptoms and risks.  Hormone replacement therapy may reduce some of these symptoms and risks. Talk to your health care provider about whether hormone replacement therapy is right for you. Follow these instructions at home:  Schedule regular health, dental, and eye exams.  Stay current with your immunizations.  Do not use any tobacco products including cigarettes, chewing tobacco, or electronic cigarettes.  If you are pregnant, do not drink alcohol.  If you are breastfeeding, limit  how much and how often you drink alcohol.  Limit alcohol intake to no more than 1 drink per day for nonpregnant women. One drink equals 12 ounces of beer, 5 ounces of wine, or 1 ounces of hard liquor.  Do not use street drugs.  Do not share needles.  Ask your health care provider for help if you need support or information about quitting drugs.  Tell your health care provider if you often feel depressed.  Tell your health care provider if you have ever been abused or do not feel safe at home. This information is not intended to replace advice given to you by your health care provider. Make sure you discuss any questions you have with your health care provider. Document Released: 06/30/2011 Document Revised: 05/22/2016 Document Reviewed: 09/18/2015 Elsevier Interactive Patient Education  2017 [employer].

## 2017-04-01 LAB — RPR: RPR: NONREACTIVE

## 2017-04-01 LAB — HIV ANTIBODY (ROUTINE TESTING W REFLEX): HIV Screen 4th Generation wRfx: NONREACTIVE

## 2017-04-01 LAB — HEPATITIS B SURFACE ANTIGEN: HEP B S AG: NEGATIVE

## 2017-04-01 LAB — HEPATITIS C ANTIBODY

## 2017-04-02 LAB — GC/CHLAMYDIA PROBE AMP
CHLAMYDIA, DNA PROBE: NEGATIVE
NEISSERIA GONORRHOEAE BY PCR: NEGATIVE

## 2017-10-29 ENCOUNTER — Telehealth: Payer: Self-pay | Admitting: Obstetrics and Gynecology

## 2017-10-29 NOTE — Telephone Encounter (Signed)
Patient called and stated that she would like for Tammy Lee to return her call in regards to her needing a [place]. Prescription for her endometriosis pain and acne. The patient did not disclose any other information other than wanting to speak with a nurse. Please advise.

## 2017-10-30 NOTE — Telephone Encounter (Signed)
Pt states that for the last few months she has really bad acne on her chin and jaw line. Right sided pelvic pain x 2 days comes and goes- no meds tried. Wants ocp to help with acne and pp. Aware mad will get this message on Monday.

## 2017-11-02 MED ORDER — NORETHIN-ETH ESTRAD-FE BIPHAS 1 MG-10 MCG / 10 MCG PO TABS: 1.0000 | ORAL_TABLET | Freq: Every day | ORAL | 11 refills | Status: DC

## 2017-11-02 NOTE — Telephone Encounter (Signed)
Per mad ok to erx lo loestrin. Pt aware per vm.

## 2018-07-09 NOTE — Progress Notes (Signed)
Patient ID: Tammy Lee, female   DOB: Nov 25, 1981, 37 y.o.   MRN: [id number] ANNUAL PREVENTATIVE CARE GYN  ENCOUNTER NOTE  Subjective:       Tammy Lee is a 37 y.o. G 3 P2012. female here for a routine annual gynecologic exam.  Current complaints:  1. Wants std screen  No significant issues with bowel or bladder function. History of endometriosis; status post TVH. Patient reports occasional flare of crampy right lower quadrant discomfort that may last several days, requiring ibuprofen or Aleve.  Not currently on any suppressive therapy for endometriosis.  Previously prescribed OCPs have not been taken.  Gynecologic History No LMP recorded. Patient has had a hysterectomy. Status post TVH Contraception: status post hysterectomy Last Pap: 02/2014 neg/neg. Results were: normal Last mammogram: n/a. Results were: n/a  Obstetric History OB History  Gravida Para Term Preterm AB Living  3 2 2   1 2   SAB TAB Ectopic Multiple Live Births      1   2    # Outcome Date GA Lbr Len/2nd Weight Sex Delivery Anes PTL Lv  3 Term 2009   6 lb 14.4 oz (3.13 kg) M Vag-Spont   LIV  2 Ectopic 2005          1 Term 1998   6 lb 2.1 oz (2.781 kg) F Vag-Spont   LIV    Past Medical History:  Diagnosis Date  . Acne   . Anxiety   . Bladder dysfunction   . Endometriosis   . GERD (gastroesophageal reflux disease)   . Hemorrhoid   . Hirsutism   . History of ovarian cyst   . 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 Mastodynia   . Status post vaginal hysterectomy     Past Surgical History:  Procedure Laterality Date  . BILATERAL TUBAL CATERY    . LAPAROSCOPY  10/2011   WITH EXCISION AND FULGURATION OF ENDOMETRIOSIS  . operative L/S with Left Salpingectomy  2005   Ectopic  . TVH     02/23/2012    Current Outpatient Medications on File Prior to Visit  Medication Sig Dispense Refill  . acyclovir (ZOVIRAX) 400 MG tablet Take 1 tablet (400 mg total) by mouth 3 (three) times daily. 15  tablet 4  . Norethindrone-Ethinyl Estradiol-Fe Biphas (LO LOESTRIN FE) 1 MG-10 MCG / 10 MCG tablet Take 1 tablet daily by mouth. 1 Package 11  . omeprazole (PRILOSEC) 20 MG capsule Take 20 mg by mouth daily.     No current facility-administered medications on file prior to visit.     Allergies  Allergen Reactions  . Citalopram Other (See Comments)  . Topiramate Other (See Comments)    sleepy  . Augmentin [Amoxicillin-Pot Clavulanate] Rash    Social History   Socioeconomic History  . Marital status: Single    Spouse name: Not on file  . Number of children: Not on file  . Years of education: Not on file  . Highest education level: Not on file  Occupational History  . Not on file  Social Needs  . Financial resource strain: Not on file  . Food insecurity:    Worry: Not on file    Inability: Not on file  . Transportation needs:    Medical: Not on file    Non-medical: Not on file  Tobacco Use  . Smoking status: Never Smoker  . Smokeless tobacco: Never Used  Substance and Sexual Activity  . Alcohol use: Yes  Comment: occas  . Drug use: No  . Sexual activity: Yes    Birth control/protection: Surgical    Comment: tvh  Lifestyle  . Physical activity:    Days per week: Not on file    Minutes per session: Not on file  . Stress: Not on file  Relationships  . Social connections:    Talks on phone: Not on file    Gets together: Not on file    Attends religious service: Not on file    Active member of club or organization: Not on file    Attends meetings of clubs or organizations: Not on file    Relationship status: Not on file  . Intimate partner violence:    Fear of current or ex partner: Not on file    Emotionally abused: Not on file    Physically abused: Not on file    Forced sexual activity: Not on file  Other Topics Concern  . Not on file  Social History Narrative  . Not on file    Family History  Problem Relation Age of Onset  . Cancer Neg Hx   .  Diabetes Neg Hx   . Heart disease Neg Hx     The following portions of the patient's history were reviewed and updated as appropriate: allergies, current medications, past family history, past medical history, past social history, past surgical history and problem list.  Review of Systems Review of Systems  Constitutional: Negative.   HENT: Negative.   Eyes: Negative.   Respiratory: Negative.   Cardiovascular: Negative.   Gastrointestinal: Positive for abdominal pain.       Occasional intermittent crampy right lower quadrant pain  Genitourinary: Negative.   Musculoskeletal: Negative.        History of leg neuropraxia, stable  Skin: Negative.   Neurological: Negative.   Endo/Heme/Allergies: Negative.   Psychiatric/Behavioral: Negative.      Objective:   BP 101/65   Pulse 93   Ht 5\' 3"  (1.6 m)   Wt 160 lb 1.6 oz (72.6 kg)   BMI 28.36 kg/m  CONSTITUTIONAL: Well-developed, well-nourished female in no acute distress.  PSYCHIATRIC: Normal mood and affect. Normal behavior. Normal judgment and thought content. NEUROLGIC: Alert and oriented to person, place, and time. Normal muscle tone coordination. No cranial nerve deficit noted. HENT:  Normocephalic, atraumatic, External right and left ear normal.  EYES: Conjunctivae and EOM are normal. No scleral icterus.  NECK: Normal range of motion, supple, no masses.  Normal thyroid.  SKIN: Skin is warm and dry. No rash noted. Not diaphoretic. No erythema. No pallor. CARDIOVASCULAR: Normal heart rate noted, regular rhythm, no murmur. RESPIRATORY: Clear to auscultation bilaterally. Effort and breath sounds normal, no problems with respiration noted. BREASTS: Symmetric in size. No masses, skin changes, nipple drainage, or lymphadenopathy. ABDOMEN: Soft, no distention noted.  No tenderness, rebound or guarding.  BLADDER: Normal PELVIC:  External Genitalia: Normal  BUS: Normal  Vagina: Normal; good vault support; cuff intact without  nodularity or tenderness; white secretions in vaginal vault  Cervix: Surgically absent  Uterus: Surgically absent  Adnexa: Normal; nonpalpable and nontender  RV: External Exam NormaI  MUSCULOSKELETAL: Normal range of motion. No tenderness.  No cyanosis, clubbing, or edema.  LYMPHATIC: No Axillary, Supraclavicular, or Inguinal Adenopathy.    Assessment:   Annual gynecologic examination 38 y.o. Contraception: status post hysterectomy TVH bmi-28 Endometriosis, stable   Plan:  Pap: not needed Mammogram: Not Indicated Stool Guaiac Testing:  Not Indicated Labs: std screen  Routine preventative health maintenance measures emphasized: Exercise/Diet/Weight control, Tobacco Warnings and Alcohol/Substance use risks  Monitor endometriosis symptoms; if symptom complex increases, notify office to consider start of progestin or OCP therapy Return to Clinic - [address]   Crystal Ash ForkMiller, CMA  Herold HarmsMartin A Bralin Garry, MD   Note: This dictation was prepared with Dragon dictation along with smaller phrase technology. Any transcriptional errors that result from this process are unintentional.

## 2018-07-13 ENCOUNTER — Ambulatory Visit (INDEPENDENT_AMBULATORY_CARE_PROVIDER_SITE_OTHER): Payer: 59 | Admitting: Obstetrics and Gynecology

## 2018-07-13 ENCOUNTER — Other Ambulatory Visit (HOSPITAL_COMMUNITY)
Admission: RE | Admit: 2018-07-13 | Discharge: 2018-07-13 | Disposition: A | Discharge status: Home / Self Care | Payer: 59 | Source: Ambulatory Visit | Attending: Obstetrics and Gynecology | Admitting: Obstetrics and Gynecology

## 2018-07-13 ENCOUNTER — Encounter: Payer: Self-pay | Admitting: Obstetrics and Gynecology

## 2018-07-13 VITALS — BP 101/65 | HR 93 | Ht 63.0 in | Wt 160.1 lb

## 2018-07-13 DIAGNOSIS — N809 Endometriosis, unspecified: Secondary | ICD-10-CM

## 2018-07-13 DIAGNOSIS — Z01419 Encounter for gynecological examination (general) (routine) without abnormal findings: Secondary | ICD-10-CM | POA: Diagnosis not present

## 2018-07-13 DIAGNOSIS — L680 Hirsutism: Secondary | ICD-10-CM

## 2018-07-13 DIAGNOSIS — Z90710 Acquired absence of both cervix and uterus: Secondary | ICD-10-CM | POA: Diagnosis not present

## 2018-07-13 DIAGNOSIS — Z202 Contact with and (suspected) exposure to infections with a predominantly sexual mode of transmission: Secondary | ICD-10-CM

## 2018-07-13 DIAGNOSIS — K219 Gastro-esophageal reflux disease without esophagitis: Secondary | ICD-10-CM | POA: Diagnosis not present

## 2018-07-13 NOTE — Patient Instructions (Signed)
1.  No Pap smear is done.  No further Pap smears are needed 2.  Self breast awareness is encouraged 3.  STD screen is obtained today. 4.  Continue with healthy eating and exercise. 5.  Safe sex practices encouraged 6.  Monitor for endometriosis symptoms.  If there is a flare and discomfort, contact office for consideration of starting progestin therapy or OCP therapy. 7.  Return in 1 year for annual exam  Health Maintenance, Female Adopting a healthy lifestyle and getting preventive care can go a long way to promote health and wellness. Talk with your health care provider about what schedule of regular examinations is right for you. This is a good chance for you to check in with your provider about disease prevention and staying healthy. In between checkups, there are plenty of things you can do on your own. Experts have done a lot of research about which lifestyle changes and preventive measures are most likely to keep you healthy. Ask your health care provider for more information. Weight and diet Eat a healthy diet  Be sure to include plenty of vegetables, fruits, low-fat dairy products, and lean protein.  Do not eat a lot of foods high in solid fats, added sugars, or salt.  Get regular exercise. This is one of the most important things you can do for your health. ? Most adults should exercise for at least 150 minutes each week. The exercise should increase your heart rate and make you sweat (moderate-intensity exercise). ? Most adults should also do strengthening exercises at least twice a week. This is in addition to the moderate-intensity exercise.  Maintain a healthy weight  Body mass index (BMI) is a measurement that can be used to identify possible weight problems. It estimates body fat based on height and weight. Your health care provider can help determine your BMI and help you achieve or maintain a healthy weight.  For females 60 years of age and older: ? A BMI below 18.5 is  considered underweight. ? A BMI of 18.5 to 24.9 is normal. ? A BMI of 25 to 29.9 is considered overweight. ? A BMI of 30 and above is considered obese.  Watch levels of cholesterol and blood lipids  You should start having your blood tested for lipids and cholesterol at 39 years of age, then have this test every 5 years.  You may need to have your cholesterol levels checked more often if: ? Your lipid or cholesterol levels are high. ? You are older than 39 years of age. ? You are at high risk for heart disease.  Cancer screening Lung Cancer  Lung cancer screening is recommended for adults 10-16 years old who are at high risk for lung cancer because of a history of smoking.  A yearly low-dose CT scan of the lungs is recommended for people who: ? Currently smoke. ? Have quit within the past 15 years. ? Have at least a 30-pack-year history of smoking. A pack year is smoking an average of one pack of cigarettes a day for 1 year.  Yearly screening should continue until it has been 15 years since you quit.  Yearly screening should stop if you develop a health problem that would prevent you from having lung cancer treatment.  Breast Cancer  Practice breast self-awareness. This means understanding how your breasts normally appear and feel.  It also means doing regular breast self-exams. Let your health care provider know about any changes, no matter how small.  If  you are in your 20s or 30s, you should have a clinical breast exam (CBE) by a health care provider every 1-3 years as part of a regular health exam.  If you are 89 or older, have a CBE every year. Also consider having a breast X-ray (mammogram) every year.  If you have a family history of breast cancer, talk to your health care provider about genetic screening.  If you are at high risk for breast cancer, talk to your health care provider about having an MRI and a mammogram every year.  Breast cancer gene (BRCA) assessment  is recommended for women who have family members with BRCA-related cancers. BRCA-related cancers include: ? Breast. ? Ovarian. ? Tubal. ? Peritoneal cancers.  Results of the assessment will determine the need for genetic counseling and BRCA1 and BRCA2 testing.  Cervical Cancer Your health care provider may recommend that you be screened regularly for cancer of the pelvic organs (ovaries, uterus, and vagina). This screening involves a pelvic examination, including checking for microscopic changes to the surface of your cervix (Pap test). You may be encouraged to have this screening done every 3 years, beginning at age 42.  For women ages 4-65, health care providers may recommend pelvic exams and Pap testing every 3 years, or they may recommend the Pap and pelvic exam, combined with testing for human papilloma virus (HPV), every 5 years. Some types of HPV increase your risk of cervical cancer. Testing for HPV may also be done on women of any age with unclear Pap test results.  Other health care providers may not recommend any screening for nonpregnant women who are considered low risk for pelvic cancer and who do not have symptoms. Ask your health care provider if a screening pelvic exam is right for you.  If you have had past treatment for cervical cancer or a condition that could lead to cancer, you need Pap tests and screening for cancer for at least 20 years after your treatment. If Pap tests have been discontinued, your risk factors (such as having a new sexual partner) need to be reassessed to determine if screening should resume. Some women have medical problems that increase the chance of getting cervical cancer. In these cases, your health care provider may recommend more frequent screening and Pap tests.  Colorectal Cancer  This type of cancer can be detected and often prevented.  Routine colorectal cancer screening usually begins at 39 years of age and continues through 39 years of  age.  Your health care provider may recommend screening at an earlier age if you have risk factors for colon cancer.  Your health care provider may also recommend using home test kits to check for hidden blood in the stool.  A small camera at the end of a tube can be used to examine your colon directly (sigmoidoscopy or colonoscopy). This is done to check for the earliest forms of colorectal cancer.  Routine screening usually begins at age 36.  Direct examination of the colon should be repeated every 5-10 years through 39 years of age. However, you may need to be screened more often if early forms of precancerous polyps or small growths are found.  Skin Cancer  Check your skin from head to toe regularly.  Tell your health care provider about any new moles or changes in moles, especially if there is a change in a mole's shape or color.  Also tell your health care provider if you have a mole that is larger  than the size of a pencil eraser.  Always use sunscreen. Apply sunscreen liberally and repeatedly throughout the day.  Protect yourself by wearing long sleeves, pants, a wide-brimmed hat, and sunglasses whenever you are outside.  Heart disease, diabetes, and high blood pressure  High blood pressure causes heart disease and increases the risk of stroke. High blood pressure is more likely to develop in: ? People who have blood pressure in the high end of the normal range (130-139/85-89 mm Hg). ? People who are overweight or obese. ? People who are African American.  If you are 69-60 years of age, have your blood pressure checked every 3-5 years. If you are 23 years of age or older, have your blood pressure checked every year. You should have your blood pressure measured twice-once when you are at a hospital or clinic, and once when you are not at a hospital or clinic. Record the average of the two measurements. To check your blood pressure when you are not at a hospital or clinic, you  can use: ? An automated blood pressure machine at a pharmacy. ? A home blood pressure monitor.  If you are between 87 years and 6 years old, ask your health care provider if you should take aspirin to prevent strokes.  Have regular diabetes screenings. This involves taking a blood sample to check your fasting blood sugar level. ? If you are at a normal weight and have a low risk for diabetes, have this test once every three years after 39 years of age. ? If you are overweight and have a high risk for diabetes, consider being tested at a younger age or more often. Preventing infection Hepatitis B  If you have a higher risk for hepatitis B, you should be screened for this virus. You are considered at high risk for hepatitis B if: ? You were born in a country where hepatitis B is common. Ask your health care provider which countries are considered high risk. ? Your parents were born in a high-risk country, and you have not been immunized against hepatitis B (hepatitis B vaccine). ? You have HIV or AIDS. ? You use needles to inject street drugs. ? You live with someone who has hepatitis B. ? You have had sex with someone who has hepatitis B. ? You get hemodialysis treatment. ? You take certain medicines for conditions, including cancer, organ transplantation, and autoimmune conditions.  Hepatitis C  Blood testing is recommended for: ? Everyone born from [birth year] through 1967. ? Anyone with known risk factors for hepatitis C.  Sexually transmitted infections (STIs)  You should be screened for sexually transmitted infections (STIs) including gonorrhea and chlamydia if: ? You are sexually active and are younger than 39 years of age. ? You are older than 39 years of age and your health care provider tells you that you are at risk for this type of infection. ? Your sexual activity has changed since you were last screened and you are at an increased risk for chlamydia or gonorrhea. Ask your  health care provider if you are at risk.  If you do not have HIV, but are at risk, it may be recommended that you take a prescription medicine daily to prevent HIV infection. This is called pre-exposure prophylaxis (PrEP). You are considered at risk if: ? You are sexually active and do not regularly use condoms or know the HIV status of your partner(s). ? You take drugs by injection. ? You are sexually active with  a partner who has HIV.  Talk with your health care provider about whether you are at high risk of being infected with HIV. If you choose to begin PrEP, you should first be tested for HIV. You should then be tested every 3 months for as long as you are taking PrEP. Pregnancy  If you are premenopausal and you may become pregnant, ask your health care provider about preconception counseling.  If you may become pregnant, take 400 to 800 micrograms (mcg) of folic acid every day.  If you want to prevent pregnancy, talk to your health care provider about birth control (contraception). Osteoporosis and menopause  Osteoporosis is a disease in which the bones lose minerals and strength with aging. This can result in serious bone fractures. Your risk for osteoporosis can be identified using a bone density scan.  If you are 47 years of age or older, or if you are at risk for osteoporosis and fractures, ask your health care provider if you should be screened.  Ask your health care provider whether you should take a calcium or vitamin D supplement to lower your risk for osteoporosis.  Menopause may have certain physical symptoms and risks.  Hormone replacement therapy may reduce some of these symptoms and risks. Talk to your health care provider about whether hormone replacement therapy is right for you. Follow these instructions at home:  Schedule regular health, dental, and eye exams.  Stay current with your immunizations.  Do not use any tobacco products including cigarettes, chewing  tobacco, or electronic cigarettes.  If you are pregnant, do not drink alcohol.  If you are breastfeeding, limit how much and how often you drink alcohol.  Limit alcohol intake to no more than 1 drink per day for nonpregnant women. One drink equals 12 ounces of beer, 5 ounces of wine, or 1 ounces of hard liquor.  Do not use street drugs.  Do not share needles.  Ask your health care provider for help if you need support or information about quitting drugs.  Tell your health care provider if you often feel depressed.  Tell your health care provider if you have ever been abused or do not feel safe at home. This information is not intended to replace advice given to you by your health care provider. Make sure you discuss any questions you have with your health care provider. Document Released: 06/30/2011 Document Revised: 05/22/2016 Document Reviewed: 09/18/2015 Elsevier Interactive Patient Education  [employer].

## 2018-07-14 LAB — RPR: RPR Ser Ql: NONREACTIVE

## 2018-07-14 LAB — GC/CHLAMYDIA PROBE AMP (~~LOC~~) NOT AT ARMC
CHLAMYDIA, DNA PROBE: NEGATIVE
NEISSERIA GONORRHEA: NEGATIVE

## 2018-07-14 LAB — HIV ANTIBODY (ROUTINE TESTING W REFLEX): HIV Screen 4th Generation wRfx: NONREACTIVE

## 2018-07-14 LAB — HEPATITIS C ANTIBODY: Hep C Virus Ab: <0.1 s/co ratio (ref 0.0–0.9)

## 2018-07-14 LAB — HEPATITIS B SURFACE ANTIGEN: Hepatitis B Surface Ag: NEGATIVE

## 2019-02-08 ENCOUNTER — Other Ambulatory Visit
Admission: RE | Admit: 2019-02-08 | Discharge: 2019-02-08 | Disposition: A | Discharge status: Home / Self Care | Payer: Managed Care, Other (non HMO) | Source: Ambulatory Visit | Attending: Gastroenterology | Admitting: Gastroenterology

## 2019-02-08 DIAGNOSIS — R197 Diarrhea, unspecified: Secondary | ICD-10-CM | POA: Insufficient documentation

## 2019-02-08 LAB — GASTROINTESTINAL PANEL BY PCR, STOOL (REPLACES STOOL CULTURE)
ADENOVIRUS F40/41: NOT DETECTED
ASTROVIRUS: NOT DETECTED
CAMPYLOBACTER SPECIES: NOT DETECTED
CYCLOSPORA CAYETANENSIS: NOT DETECTED
Cryptosporidium: NOT DETECTED
ENTAMOEBA HISTOLYTICA: NOT DETECTED
ENTEROPATHOGENIC E COLI (EPEC): NOT DETECTED
ENTEROTOXIGENIC E COLI (ETEC): NOT DETECTED
Enteroaggregative E coli (EAEC): NOT DETECTED
Giardia lamblia: NOT DETECTED
Norovirus GI/GII: NOT DETECTED
Plesimonas shigelloides: NOT DETECTED
Rotavirus A: NOT DETECTED
Salmonella species: NOT DETECTED
Sapovirus (I, II, IV, and V): NOT DETECTED
Shiga like toxin producing E coli (STEC): NOT DETECTED
Shigella/Enteroinvasive E coli (EIEC): NOT DETECTED
VIBRIO CHOLERAE: NOT DETECTED
VIBRIO SPECIES: NOT DETECTED
Yersinia enterocolitica: NOT DETECTED

## 2019-07-11 DIAGNOSIS — E7800 Pure hypercholesterolemia, unspecified: Secondary | ICD-10-CM | POA: Insufficient documentation

## 2019-07-11 DIAGNOSIS — Z6831 Body mass index (BMI) 31.0-31.9, adult: Secondary | ICD-10-CM | POA: Insufficient documentation

## 2019-07-19 ENCOUNTER — Encounter: Payer: 59 | Admitting: Obstetrics and Gynecology

## 2020-01-12 DIAGNOSIS — D708 Other neutropenia: Secondary | ICD-10-CM | POA: Insufficient documentation

## 2020-01-13 ENCOUNTER — Other Ambulatory Visit: Payer: Self-pay

## 2020-01-13 ENCOUNTER — Inpatient Hospital Stay: Payer: Managed Care, Other (non HMO) | Attending: Oncology | Admitting: Oncology

## 2020-01-13 ENCOUNTER — Inpatient Hospital Stay: Payer: Managed Care, Other (non HMO)

## 2020-01-13 ENCOUNTER — Encounter: Payer: Self-pay | Admitting: Oncology

## 2020-01-13 VITALS — BP 142/94 | HR 83 | Temp 97.8°F | Resp 18 | Wt 173.0 lb

## 2020-01-13 DIAGNOSIS — Z79899 Other long term (current) drug therapy: Secondary | ICD-10-CM | POA: Insufficient documentation

## 2020-01-13 DIAGNOSIS — D72819 Decreased white blood cell count, unspecified: Secondary | ICD-10-CM | POA: Insufficient documentation

## 2020-01-13 DIAGNOSIS — F419 Anxiety disorder, unspecified: Secondary | ICD-10-CM | POA: Insufficient documentation

## 2020-01-13 DIAGNOSIS — K219 Gastro-esophageal reflux disease without esophagitis: Secondary | ICD-10-CM | POA: Diagnosis not present

## 2020-01-13 DIAGNOSIS — Z8719 Personal history of other diseases of the digestive system: Secondary | ICD-10-CM | POA: Diagnosis not present

## 2020-01-13 LAB — FERRITIN: Ferritin: 145 ng/mL (ref 11–307)

## 2020-01-13 LAB — CBC WITH DIFFERENTIAL/PLATELET
Abs Immature Granulocytes: 0.01 10*3/uL (ref 0.00–0.07)
Basophils Absolute: 0 10*3/uL (ref 0.0–0.1)
Basophils Relative: 1 %
Eosinophils Absolute: 0 10*3/uL (ref 0.0–0.5)
Eosinophils Relative: 0 %
HCT: 39.4 % (ref 36.0–46.0)
Hemoglobin: 12.9 g/dL (ref 12.0–15.0)
Immature Granulocytes: 0 %
Lymphocytes Relative: 37 %
Lymphs Abs: 1.1 10*3/uL (ref 0.7–4.0)
MCH: 30.4 pg (ref 26.0–34.0)
MCHC: 32.7 g/dL (ref 30.0–36.0)
MCV: 92.9 fL (ref 80.0–100.0)
Monocytes Absolute: 0.3 10*3/uL (ref 0.1–1.0)
Monocytes Relative: 12 %
Neutro Abs: 1.4 10*3/uL — ABNORMAL LOW (ref 1.7–7.7)
Neutrophils Relative %: 50 %
Platelets: 217 10*3/uL (ref 150–400)
RBC: 4.24 MIL/uL (ref 3.87–5.11)
RDW: 11.9 % (ref 11.5–15.5)
WBC: 2.9 10*3/uL — ABNORMAL LOW (ref 4.0–10.5)
nRBC: 0 % (ref 0.0–0.2)

## 2020-01-13 LAB — VITAMIN B12: Vitamin B-12: 463 pg/mL (ref 180–914)

## 2020-01-13 LAB — IRON AND TIBC
Iron: 81 ug/dL (ref 28–170)
Saturation Ratios: 24 % (ref 10.4–31.8)
TIBC: 340 ug/dL (ref 250–450)
UIBC: 259 ug/dL

## 2020-01-13 LAB — LACTATE DEHYDROGENASE: LDH: 124 U/L (ref 98–192)

## 2020-01-13 LAB — FOLATE: Folate: 15.1 ng/mL (ref >5.9)

## 2020-01-13 NOTE — Progress Notes (Signed)
[facility]  [phone]) [phone] Fax:(336) [phone]  ID: Clint Guy OB: [birth year]  MR#: [id number]  KXF#:[id number]  Patient Care Team: Dion Body, MD as PCP - General (Family Medicine)  CHIEF COMPLAINT: Leukopenia.  INTERVAL HISTORY: Patient is a 39 year old female who was noted to have a decreased white blood cell count with neutropenia on routine yearly blood work.  Repeat laboratory work revealed a persistent decrease.  Currently, patient feels well and is asymptomatic.  She denies any recent fevers or illnesses.  She has no new medications.  She had no neurologic complaints.  She has a good appetite and denies weight loss.  She has no chest pain, shortness of breath, cough, or hemoptysis.  She denies any nausea, vomiting, constipation, or diarrhea.  She has no urinary complaints.  Patient feels at her baseline offers no specific complaints today.  REVIEW OF SYSTEMS:   Review of Systems  Constitutional: Negative.  Negative for diaphoresis, fever, malaise/fatigue and weight loss.  Respiratory: Negative.  Negative for cough and shortness of breath.   Cardiovascular: Negative.  Negative for chest pain and leg swelling.  Gastrointestinal: Negative.  Negative for abdominal pain.  Genitourinary: Negative.  Negative for dysuria.  Musculoskeletal: Negative.  Negative for back pain.  Skin: Negative.  Negative for rash.  Neurological: Negative.  Negative for dizziness, focal weakness, weakness and headaches.  Psychiatric/Behavioral: Negative.  The patient is not nervous/anxious.     As per HPI. Otherwise, a complete review of systems is negative.  PAST MEDICAL HISTORY: Past Medical History:  Diagnosis Date  . Acne   . Anxiety   . Bladder dysfunction   . Endometriosis   . GERD (gastroesophageal reflux disease)   . Hemorrhoid   . Hirsutism   . History of ovarian cyst   . 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 Mastodynia   . Status post  vaginal hysterectomy     PAST SURGICAL HISTORY: Past Surgical History:  Procedure Laterality Date  . BILATERAL TUBAL CATERY    . LAPAROSCOPY  10/2011   WITH EXCISION AND FULGURATION OF ENDOMETRIOSIS  . operative L/S with Left Salpingectomy  2005   Ectopic  . TVH     02/23/2012    FAMILY HISTORY: Family History  Problem Relation Age of Onset  . Cancer Neg Hx   . Diabetes Neg Hx   . Heart disease Neg Hx     ADVANCED DIRECTIVES (Y/N):  N  HEALTH MAINTENANCE: Social History   Tobacco Use  . Smoking status: Never Smoker  . Smokeless tobacco: Never Used  Substance Use Topics  . Alcohol use: Yes    Comment: occas  . Drug use: No     Colonoscopy:  PAP:  Bone density:  Lipid panel:  Allergies  Allergen Reactions  . Citalopram Other (See Comments)  . Topiramate Other (See Comments)    sleepy  . Augmentin [Amoxicillin-Pot Clavulanate] Rash    Current Outpatient Medications  Medication Sig Dispense Refill  . acyclovir (ZOVIRAX) 400 MG tablet Take 1 tablet (400 mg total) by mouth 3 (three) times daily. 15 tablet 4  . Multiple Vitamins-Minerals (AIRBORNE GUMMIES PO)     . omeprazole (PRILOSEC) 20 MG capsule Take 20 mg by mouth daily.    Marland Kitchen OVER THE COUNTER MEDICATION MACA daily    . Probiotic Product (PROBIOTIC ADVANCED PO) Take by mouth.     No current facility-administered medications for this visit.    OBJECTIVE: Vitals:   01/13/20 6967  BP: (!) 142/94  Pulse: 83  Resp: 18  Temp: 97.8 F (36.6 C)  SpO2: 100%     Body mass index is 30.65 kg/m.    ECOG FS:0 - Asymptomatic  General: Well-developed, well-nourished, no acute distress. Eyes: Pink conjunctiva, anicteric sclera. HEENT: Normocephalic, moist mucous membranes. Lungs: No audible wheezing or coughing. Heart: Regular rate and rhythm. Abdomen: Soft, nontender, no obvious distention. Musculoskeletal: No edema, cyanosis, or clubbing. Neuro: Alert, answering all questions appropriately. Cranial nerves  grossly intact. Skin: No rashes or petechiae noted. Psych: Normal affect. Lymphatics: No cervical, calvicular, axillary or inguinal LAD.   LAB RESULTS:  No results found for: NA, K, CL, CO2, GLUCOSE, BUN, CREATININE, CALCIUM, PROT, ALBUMIN, AST, ALT, ALKPHOS, BILITOT, GFRNONAA, GFRAA  Lab Results  Component Value Date   WBC 2.9 (L) 01/13/2020   NEUTROABS 1.4 (L) 01/13/2020   HGB 12.9 01/13/2020   HCT 39.4 01/13/2020   MCV 92.9 01/13/2020   PLT 217 01/13/2020     STUDIES: No results found.  ASSESSMENT: Leukopenia.  PLAN:   1.  Leukopenia: Repeat blood work from today revealed a total white blood cell count of 2.9 with an ANC of 1.4.  The remainder of her blood work today including iron stores, B12, folate, peripheral blood flow cytometry, and neutrophil antibodies are pending at time of dictation.  Since patient is asymptomatic, no intervention is needed.  She does not require bone marrow biopsy at this time.  She will have a video assisted telemedicine visit in 3 weeks to discuss her laboratory work and any additional diagnostic testing necessary.  I spent a total of 45 minutes reviewing chart data, face-to-face evaluation with the patient, counseling and coordination of care as detailed above.   Patient expressed understanding and was in agreement with this plan. She also understands that She can call clinic at any time with any questions, concerns, or complaints.    Lloyd Huger, MD   01/13/2020 10:41 AM

## 2020-01-13 NOTE — Progress Notes (Signed)
Pt here for new referral for neutropenia,

## 2020-01-18 LAB — COMP PANEL: LEUKEMIA/LYMPHOMA

## 2020-01-27 NOTE — Progress Notes (Signed)
[facility]  Telephone:(336) [phone] Fax:(336) [phone]  ID: Clint Guy OB: 10-18-1981  MR#: [id number]  [id number]#:[id number]  Patient Care Team: Dion Body, MD as PCP - General (Family Medicine)  I connected with Clint Guy on 02/03/20 at 10:00 AM EST by video enabled telemedicine visit and verified that I am speaking with the correct person using two identifiers.   I discussed the limitations, risks, security and privacy concerns of performing an evaluation and management service by telemedicine and the availability of in-person appointments. I also discussed with the patient that there may be a patient responsible charge related to this service. The patient expressed understanding and agreed to proceed.   Other persons participating in the visit and their role in the encounter: Patient, MD.  Patient's location: Home. Provider's location: Clinic.  CHIEF COMPLAINT: Leukopenia.  INTERVAL HISTORY: Patient agreed to video assisted telemedicine visit for further evaluation and discussion of her laboratory results.  She recently contracted Covid and is now under quarantine for the next 10 days.  She otherwise feels well and is asymptomatic.  She denies any fevers.  She had no neurologic complaints.  She has a good appetite and denies weight loss.  She has no chest pain, shortness of breath, cough, or hemoptysis.  She denies any nausea, vomiting, constipation, or diarrhea.  She has no urinary complaints.  Patient offers no further specific complaints today.    REVIEW OF SYSTEMS:   Review of Systems  Constitutional: Negative.  Negative for diaphoresis, fever, malaise/fatigue and weight loss.  Respiratory: Negative.  Negative for cough and shortness of breath.   Cardiovascular: Negative.  Negative for chest pain and leg swelling.  Gastrointestinal: Negative.  Negative for abdominal pain.  Genitourinary: Negative.  Negative for dysuria.  Musculoskeletal: Negative.   Negative for back pain.  Skin: Negative.  Negative for rash.  Neurological: Negative.  Negative for dizziness, focal weakness, weakness and headaches.  Psychiatric/Behavioral: Negative.  The patient is not nervous/anxious.     As per HPI. Otherwise, a complete review of systems is negative.  PAST MEDICAL HISTORY: Past Medical History:  Diagnosis Date  . Acne   . Anxiety   . Bladder dysfunction   . Endometriosis   . GERD (gastroesophageal reflux disease)   . Hemorrhoid   . Hirsutism   . History of ovarian cyst   . 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 Mastodynia   . Status post vaginal hysterectomy     PAST SURGICAL HISTORY: Past Surgical History:  Procedure Laterality Date  . BILATERAL TUBAL CATERY    . LAPAROSCOPY  10/2011   WITH EXCISION AND FULGURATION OF ENDOMETRIOSIS  . operative L/S with Left Salpingectomy  2005   Ectopic  . TVH     02/23/2012    FAMILY HISTORY: Family History  Problem Relation Age of Onset  . Cancer Neg Hx   . Diabetes Neg Hx   . Heart disease Neg Hx     ADVANCED DIRECTIVES (Y/N):  N  HEALTH MAINTENANCE: Social History   Tobacco Use  . Smoking status: Never Smoker  . Smokeless tobacco: Never Used  Substance Use Topics  . Alcohol use: Yes    Comment: occas  . Drug use: No     Colonoscopy:  PAP:  Bone density:  Lipid panel:  Allergies  Allergen Reactions  . Citalopram Other (See Comments)  . Topiramate Other (See Comments)    sleepy  . Augmentin [Amoxicillin-Pot Clavulanate] Rash  Current Outpatient Medications  Medication Sig Dispense Refill  . azithromycin (ZITHROMAX) 250 MG tablet Two tabs today, then one daily    . benzonatate (TESSALON) 200 MG capsule Take by mouth.    . Multiple Vitamins-Minerals (AIRBORNE GUMMIES PO)     . omeprazole (PRILOSEC) 20 MG capsule Take 20 mg by mouth daily.    Marland Kitchen OVER THE COUNTER MEDICATION MACA daily    . predniSONE (DELTASONE) 20 MG tablet Take by mouth.    . Probiotic  Product (PROBIOTIC ADVANCED PO) Take by mouth.     No current facility-administered medications for this visit.    OBJECTIVE: There were no vitals filed for this visit.   Body mass index is 29.94 kg/m.    ECOG FS:0 - Asymptomatic  General: Well-developed, well-nourished, no acute distress. HEENT: Normocephalic. Neuro: Alert, answering all questions appropriately. Cranial nerves grossly intact. Psych: Normal affect.   LAB RESULTS:  No results found for: NA, K, CL, CO2, GLUCOSE, BUN, CREATININE, CALCIUM, PROT, ALBUMIN, AST, ALT, ALKPHOS, BILITOT, GFRNONAA, GFRAA  Lab Results  Component Value Date   WBC 2.9 (L) 01/13/2020   NEUTROABS 1.4 (L) 01/13/2020   HGB 12.9 01/13/2020   HCT 39.4 01/13/2020   MCV 92.9 01/13/2020   PLT 217 01/13/2020     STUDIES: No results found.  ASSESSMENT: Leukopenia.  PLAN:   1.  Leukopenia: Repeat blood work from today revealed a total white blood cell count of 2.9 with an ANC of 1.4.  The remainder of blood work including iron stores, B12, folate, peripheral blood flow cytometry, and neutrophil antibodies are all either negative or within normal limits.  No intervention is needed at this time.  Patient does not require bone marrow biopsy.  Return to clinic in 4 months with repeat laboratory work and video assisted telemedicine visit.  If patient remains asymptomatic and her blood work remained stable, she likely can be discharged from clinic. 2.  Covid positivity: Continue quarantine for 10 days as per CDC guidelines.    I provided 20 minutes of face-to-face video visit time during this encounter which included chart review, counseling, and coordination of care as documented above.   Patient expressed understanding and was in agreement with this plan. She also understands that She can call clinic at any time with any questions, concerns, or complaints.    Lloyd Huger, MD   02/03/2020 2:37 PM

## 2020-02-02 ENCOUNTER — Other Ambulatory Visit: Payer: Self-pay

## 2020-02-02 ENCOUNTER — Encounter: Payer: Self-pay | Admitting: Oncology

## 2020-02-03 ENCOUNTER — Inpatient Hospital Stay: Payer: Managed Care, Other (non HMO) | Attending: Oncology | Admitting: Oncology

## 2020-02-03 VITALS — Wt 169.0 lb

## 2020-02-03 DIAGNOSIS — D72819 Decreased white blood cell count, unspecified: Secondary | ICD-10-CM | POA: Diagnosis not present

## 2020-02-07 ENCOUNTER — Emergency Department
Admission: EM | Admit: 2020-02-07 | Discharge: 2020-02-07 | Disposition: A | Discharge status: Home / Self Care | Payer: Managed Care, Other (non HMO) | Attending: Emergency Medicine | Admitting: Emergency Medicine

## 2020-02-07 ENCOUNTER — Other Ambulatory Visit: Payer: Self-pay

## 2020-02-07 ENCOUNTER — Encounter: Payer: Self-pay | Admitting: Emergency Medicine

## 2020-02-07 DIAGNOSIS — E876 Hypokalemia: Secondary | ICD-10-CM | POA: Diagnosis not present

## 2020-02-07 DIAGNOSIS — U071 COVID-19: Secondary | ICD-10-CM | POA: Diagnosis not present

## 2020-02-07 DIAGNOSIS — R42 Dizziness and giddiness: Secondary | ICD-10-CM | POA: Diagnosis present

## 2020-02-07 DIAGNOSIS — Z79899 Other long term (current) drug therapy: Secondary | ICD-10-CM | POA: Insufficient documentation

## 2020-02-07 DIAGNOSIS — E860 Dehydration: Secondary | ICD-10-CM

## 2020-02-07 LAB — URINALYSIS, COMPLETE (UACMP) WITH MICROSCOPIC
Bacteria, UA: NONE SEEN
Bilirubin Urine: NEGATIVE
Glucose, UA: NEGATIVE mg/dL
Hgb urine dipstick: NEGATIVE
Ketones, ur: NEGATIVE mg/dL
Leukocytes,Ua: NEGATIVE
Nitrite: NEGATIVE
Protein, ur: NEGATIVE mg/dL
Specific Gravity, Urine: 1.005 (ref 1.005–1.030)
pH: 7 (ref 5.0–8.0)

## 2020-02-07 LAB — BASIC METABOLIC PANEL
Anion gap: 10 (ref 5–15)
BUN: 9 mg/dL (ref 6–20)
CO2: 26 mmol/L (ref 22–32)
Calcium: 8.6 mg/dL — ABNORMAL LOW (ref 8.9–10.3)
Chloride: 100 mmol/L (ref 98–111)
Creatinine, Ser: 0.71 mg/dL (ref 0.44–1.00)
GFR calc Af Amer: >60 mL/min (ref >60)
GFR calc non Af Amer: >60 mL/min (ref >60)
Glucose, Bld: 110 mg/dL — ABNORMAL HIGH (ref 70–99)
Potassium: 2.9 mmol/L — ABNORMAL LOW (ref 3.5–5.1)
Sodium: 136 mmol/L (ref 135–145)

## 2020-02-07 LAB — CBC
HCT: 38.7 % (ref 36.0–46.0)
Hemoglobin: 13.2 g/dL (ref 12.0–15.0)
MCH: 30.8 pg (ref 26.0–34.0)
MCHC: 34.1 g/dL (ref 30.0–36.0)
MCV: 90.4 fL (ref 80.0–100.0)
Platelets: 163 10*3/uL (ref 150–400)
RBC: 4.28 MIL/uL (ref 3.87–5.11)
RDW: 12 % (ref 11.5–15.5)
WBC: 3.5 10*3/uL — ABNORMAL LOW (ref 4.0–10.5)
nRBC: 0 % (ref 0.0–0.2)

## 2020-02-07 MED ORDER — ONDANSETRON 4 MG PO TBDP: 4.0000 mg | ORAL_TABLET | Freq: Three times a day (TID) | ORAL | 0 refills | Status: DC | PRN

## 2020-02-07 MED ORDER — LACTATED RINGERS IV BOLUS
2000.0000 mL | Freq: Once | INTRAVENOUS | Status: AC
  Administered 2020-02-07: 2000 mL via INTRAVENOUS

## 2020-02-07 MED ORDER — PROMETHAZINE HCL 25 MG RE SUPP: 25.0000 mg | Freq: Three times a day (TID) | RECTAL | 0 refills | Status: DC | PRN

## 2020-02-07 MED ORDER — ONDANSETRON HCL 4 MG/2ML IJ SOLN
4.0000 mg | Freq: Once | INTRAMUSCULAR | Status: AC
  Administered 2020-02-07: 18:00:00 4 mg via INTRAVENOUS
  Filled 2020-02-07: qty 2

## 2020-02-07 MED ORDER — ALUM & MAG HYDROXIDE-SIMETH 200-200-20 MG/5ML PO SUSP
30.0000 mL | Freq: Once | ORAL | Status: AC
  Administered 2020-02-07: 19:00:00 30 mL via ORAL
  Filled 2020-02-07: qty 30

## 2020-02-07 MED ORDER — POTASSIUM CHLORIDE CRYS ER 20 MEQ PO TBCR
40.0000 meq | EXTENDED_RELEASE_TABLET | Freq: Once | ORAL | Status: AC
  Administered 2020-02-07: 19:00:00 40 meq via ORAL
  Filled 2020-02-07: qty 2

## 2020-02-07 MED ORDER — POTASSIUM CHLORIDE ER 10 MEQ PO TBCR: 10.0000 meq | EXTENDED_RELEASE_TABLET | Freq: Two times a day (BID) | ORAL | 0 refills | Status: DC

## 2020-02-07 NOTE — ED Triage Notes (Signed)
Patient reports she was diagnosed with COVID last week. States she finished her abx and steroid Sunday and had improvement of symptoms. Reports last night, she began to feel increasingly fatigued and had nausea and vomiting. Patient states today she is having intermittent dizziness and states "my heart is racing and I feel dehydrated".

## 2020-02-07 NOTE — ED Provider Notes (Signed)
[facility] Emergency Department Provider Note  ____________________________________________   First MD Initiated Contact with Patient 02/07/20 1654     (approximate)  I have reviewed the triage vital signs and the nursing notes.   HISTORY  Chief Complaint Dizziness and Weakness    HPI Tammy Lee is a 39 y.o. female with past medical history as below here with dizziness, weakness.  Patient has known recent Covid diagnosis.  She was recently seen by her PCP and treated with antibiotics as well as prednisone.  She has completed this course.  +2 days, however, approximately 1 week after initial diagnosis, she has had persistently worsening nausea, vomiting, difficult tolerating p.o.  She has had generalized lightheadedness and generalized weakness.  She denies any worsening cough or shortness of breath.  No chest pain.  She went to her PCP today, who sent her here due to concern for dehydration.  No chest pain.  No other complaints.  She states her breathing is actually improved and is remained improved.  She does state she has history of gastritis/GERD, and feels like the prednisone is may be made this worse.        Past Medical History:  Diagnosis Date  . Acne   . Anxiety   . Bladder dysfunction   . Endometriosis   . GERD (gastroesophageal reflux disease)   . Hemorrhoid   . Hirsutism   . History of ovarian cyst   . 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 Mastodynia   . Status post vaginal hysterectomy     Patient Active Problem List   Diagnosis Date Noted  . Other neutropenia ([place]) 01/12/2020  . Pure hypercholesterolemia 07/11/2019  . Class 1 obesity due to excess calories without serious comorbidity with body mass index (BMI) of 31.0 to 31.9 in adult 07/11/2019  . Numbness and tingling of lower extremity 02/23/2017  . Vitamin D deficiency 04/07/2016  . Status post vaginal hysterectomy 03/25/2016  . Anxiety 10/18/2015  . History of  ectopic pregnancy 10/18/2015  . Ovarian cyst 10/18/2015  . Endometriosis 10/18/2015  . GERD (gastroesophageal reflux disease) 10/18/2015  . Headache disorder 05/24/2015    Past Surgical History:  Procedure Laterality Date  . BILATERAL TUBAL CATERY    . LAPAROSCOPY  10/2011   WITH EXCISION AND FULGURATION OF ENDOMETRIOSIS  . operative L/S with Left Salpingectomy  2005   Ectopic  . TVH     02/23/2012    Prior to Admission medications   Medication Sig Start Date End Date Taking? Authorizing Provider  omeprazole (PRILOSEC) 20 MG capsule Take 20 mg by mouth daily.   Yes [provider]  Thiamine HCl (VITAMIN B-1) 250 MG tablet Take 250 mg by mouth daily.   Yes [provider]  ondansetron (ZOFRAN ODT) 4 MG disintegrating tablet Take 1 tablet (4 mg total) by mouth every 8 (eight) hours as needed for nausea or vomiting. 02/07/20   Duffy Bruce, MD  potassium chloride (KLOR-CON) 10 MEQ tablet Take 1 tablet (10 mEq total) by mouth 2 (two) times daily for 3 days. 02/07/20 02/10/20  Duffy Bruce, MD  promethazine (PHENERGAN) 25 MG suppository Place 1 suppository (25 mg total) rectally every 8 (eight) hours as needed for refractory nausea / vomiting. 02/07/20 02/06/21  Duffy Bruce, MD    Allergies Citalopram, Topiramate, and Augmentin [amoxicillin-pot clavulanate]  Family History  Problem Relation Age of Onset  . Cancer Neg Hx   . Diabetes Neg Hx   .  Heart disease Neg Hx     Social History Social History   Tobacco Use  . Smoking status: Never Smoker  . Smokeless tobacco: Never Used  Substance Use Topics  . Alcohol use: Yes    Comment: occas  . Drug use: No    Review of Systems  Review of Systems  Constitutional: Positive for fatigue. Negative for fever.  HENT: Negative for congestion and sore throat.   Eyes: Negative for visual disturbance.  Respiratory: Negative for cough and shortness of breath.   Cardiovascular: Negative for chest pain.    Gastrointestinal: Positive for nausea and vomiting. Negative for abdominal pain and diarrhea.  Genitourinary: Negative for flank pain.  Musculoskeletal: Negative for back pain and neck pain.  Skin: Negative for rash and wound.  Neurological: Positive for weakness and light-headedness.  All other systems reviewed and are negative.    ____________________________________________  PHYSICAL EXAM:      VITAL SIGNS: ED Triage Vitals  Enc Vitals Group     BP 02/07/20 1541 (!) 119/59     Pulse Rate 02/07/20 1541 (!) 112     Resp 02/07/20 1541 18     Temp 02/07/20 1541 99.2 F (37.3 C)     Temp Source 02/07/20 1541 Oral     SpO2 02/07/20 1541 97 %     Weight 02/07/20 1542 165 lb (74.8 kg)     Height 02/07/20 1542 5\' 2"  (1.575 m)     Head Circumference --      Peak Flow --      Pain Score 02/07/20 1546 0     Pain Loc --      Pain Edu? --      Excl. in GC? --      Physical Exam Vitals and nursing note reviewed.  Constitutional:      General: She is not in acute distress.    Appearance: She is well-developed.  HENT:     Head: Normocephalic and atraumatic.     Mouth/Throat:     Mouth: Mucous membranes are dry.  Eyes:     Conjunctiva/sclera: Conjunctivae normal.  Cardiovascular:     Rate and Rhythm: Regular rhythm. Tachycardia present.     Heart sounds: Normal heart sounds. No murmur. No friction rub.  Pulmonary:     Effort: Pulmonary effort is normal. No respiratory distress.     Breath sounds: Normal breath sounds. No wheezing or rales.  Abdominal:     General: Abdomen is flat. There is no distension.     Palpations: Abdomen is soft.     Tenderness: There is no abdominal tenderness.  Musculoskeletal:     Cervical back: Neck supple.  Skin:    General: Skin is warm.     Capillary Refill: Capillary refill takes less than 2 seconds.  Neurological:     Mental Status: She is alert and oriented to person, place, and time.     Motor: No abnormal muscle tone.        ____________________________________________   LABS (all labs ordered are listed, but only abnormal results are displayed)  Labs Reviewed  BASIC METABOLIC PANEL - Abnormal; Notable for the following components:      Result Value   Potassium 2.9 (*)    Glucose, Bld 110 (*)    Calcium 8.6 (*)    All other components within normal limits  CBC - Abnormal; Notable for the following components:   WBC 3.5 (*)    All other components within normal limits  URINALYSIS, COMPLETE (UACMP) WITH MICROSCOPIC - Abnormal; Notable for the following components:   Color, Urine STRAW (*)    APPearance CLEAR (*)    All other components within normal limits    ____________________________________________  EKG: Sinus tachycardia, VR 111. PR 136, QRS 82, QTc 435. Non-specific TWC, no acute ST elevations or depressions. ________________________________________  RADIOLOGY All imaging, including plain films, CT scans, and ultrasounds, independently reviewed by me, and interpretations confirmed via formal radiology reads.  ED MD interpretation:   None  Official radiology report(s): No results found.  ____________________________________________  PROCEDURES   Procedure(s) performed (including Critical Care):  Procedures  ____________________________________________  INITIAL IMPRESSION / MDM / ASSESSMENT AND PLAN / ED COURSE  As part of my medical decision making, I reviewed the following data within the electronic MEDICAL RECORD NUMBER Nursing notes reviewed and incorporated, Old chart reviewed, Notes from prior ED visits, and [place] Controlled Substance Database       *Tammy Lee was evaluated in Emergency Department on 02/07/2020 for the symptoms described in the history of present illness. She was evaluated in the context of the global COVID-19 pandemic, which necessitated consideration that the patient might be at risk for infection with the SARS-CoV-2 virus that causes COVID-19.  Institutional protocols and algorithms that pertain to the evaluation of patients at risk for COVID-19 are in a state of rapid change based on information released by regulatory bodies including the CDC and federal and state organizations. These policies and algorithms were followed during the patient's care in the ED.  Some ED evaluations and interventions may be delayed as a result of limited staffing during the pandemic.*  Clinical Course as of Feb 07 2000  Tue Feb 07, 2020  1735 39 yo F here with n/v, lightheadedness in setting of COVID-19 x 1 week. Suspect nausea, dehydration 2/2 COVID with poor PO intake, possibly also a mild gastritis from prednisone/ABX use. Abdomen is soft, NT, ND, without focal TTP to suggest cholecystitis, appendicitis, or other intraabdominal emergency. Will start IVF, Zofran, reassess. Mild hypokalemia and leukopenia noted on labs likely due to COVID and dehydration. LR given.   [CI]  2001 Patient feels significantly improved after antiemetics.  She is tolerating p.o.  She was given IV fluids and p.o. potassium.  Will discharge with continued supportive care.  As mentioned, no focal abdominal tenderness.   [CI]    Clinical Course User Index [CI] Shaune Pollack, MD    Medical Decision Making:  As above.  ____________________________________________  FINAL CLINICAL IMPRESSION(S) / ED DIAGNOSES  Final diagnoses:  Dehydration  Hypokalemia  COVID-19     MEDICATIONS GIVEN DURING THIS VISIT:  Medications  lactated ringers bolus 2,000 mL (2,000 mLs Intravenous New Bag/Given 02/07/20 1734)  ondansetron (ZOFRAN) injection 4 mg (4 mg Intravenous Given 02/07/20 1802)  alum & mag hydroxide-simeth (MAALOX/MYLANTA) 200-200-20 MG/5ML suspension 30 mL (30 mLs Oral Given 02/07/20 1901)  potassium chloride SA (KLOR-CON) CR tablet 40 mEq (40 mEq Oral Given 02/07/20 1911)     ED Discharge Orders         Ordered    ondansetron (ZOFRAN ODT) 4 MG disintegrating tablet  Every 8  hours PRN     02/07/20 2000    promethazine (PHENERGAN) 25 MG suppository  Every 8 hours PRN     02/07/20 2000    potassium chloride (KLOR-CON) 10 MEQ tablet  2 times daily     02/07/20 2000           Note:  This document was prepared using Dragon voice recognition software and may include unintentional dictation errors.   Shaune Pollack, MD 02/07/20 2001

## 2020-02-14 LAB — NEUTROPHIL AB TEST LEVEL 1: NEUTROPHIL SCR/PANEL INTERP.: NEGATIVE

## 2020-03-26 ENCOUNTER — Ambulatory Visit: Payer: Managed Care, Other (non HMO) | Attending: Internal Medicine

## 2020-03-26 ENCOUNTER — Ambulatory Visit: Payer: Self-pay | Attending: Internal Medicine

## 2020-03-26 DIAGNOSIS — Z23 Encounter for immunization: Secondary | ICD-10-CM

## 2020-03-26 NOTE — Progress Notes (Signed)
   Covid-19 Vaccination Clinic  Name:  Tammy Lee    MRN: [id number] DOB: 01-28-1981  03/26/2020  [name] was observed post Covid-19 immunization for 15 minutes without incident. She was provided with Vaccine Information Sheet and instruction to access the V-Safe system.   [name] was instructed to call 911 with any severe reactions post vaccine: Marland Kitchen Difficulty breathing  . Swelling of face and throat  . A fast heartbeat  . A bad rash all over body  . Dizziness and weakness   Immunizations Administered    Name Date Dose VIS Date Route   Pfizer COVID-19 Vaccine 03/26/2020 11:41 AM 0.3 mL 12/09/2019 Intramuscular   Manufacturer: [employer], [employer]   Lot: [id number]   NDC: [address]-4

## 2020-04-16 ENCOUNTER — Ambulatory Visit: Payer: Self-pay | Attending: Internal Medicine

## 2020-04-16 DIAGNOSIS — Z23 Encounter for immunization: Secondary | ICD-10-CM

## 2020-04-16 NOTE — Progress Notes (Signed)
   Covid-19 Vaccination Clinic  Name:  Tammy Lee    MRN: [id number] DOB: 18-Feb-1981  04/16/2020  [name] was observed post Covid-19 immunization for 15 minutes without incident. She was provided with Vaccine Information Sheet and instruction to access the V-Safe system.   [name] was instructed to call 911 with any severe reactions post vaccine: Marland Kitchen Difficulty breathing  . Swelling of face and throat  . A fast heartbeat  . A bad rash all over body  . Dizziness and weakness   Immunizations Administered    Name Date Dose VIS Date Route   Pfizer COVID-19 Vaccine 04/16/2020 11:01 AM 0.3 mL 02/22/2019 Intramuscular   Manufacturer: [employer], [employer]   Lot: [id number]   NDC: [address]-3

## 2020-05-02 ENCOUNTER — Other Ambulatory Visit: Payer: Self-pay | Admitting: Obstetrics & Gynecology

## 2020-05-02 DIAGNOSIS — Z1231 Encounter for screening mammogram for malignant neoplasm of breast: Secondary | ICD-10-CM

## 2020-05-07 ENCOUNTER — Ambulatory Visit
Admission: RE | Admit: 2020-05-07 | Discharge: 2020-05-07 | Disposition: A | Discharge status: Home / Self Care | Payer: Managed Care, Other (non HMO) | Source: Ambulatory Visit | Attending: Obstetrics & Gynecology | Admitting: Obstetrics & Gynecology

## 2020-05-07 DIAGNOSIS — Z1231 Encounter for screening mammogram for malignant neoplasm of breast: Secondary | ICD-10-CM | POA: Diagnosis present

## 2020-05-16 ENCOUNTER — Telehealth: Payer: Self-pay | Admitting: Oncology

## 2020-05-16 NOTE — Telephone Encounter (Signed)
Received secure chat from [facility] that pt wanted lab orders sent to [facility] for drawing due to insurance. Team was attached to message and Dr. Orlie Dakin agreed. Lab appt to be canceled for pt.

## 2020-05-31 ENCOUNTER — Other Ambulatory Visit: Payer: Managed Care, Other (non HMO)

## 2020-05-31 ENCOUNTER — Encounter: Payer: Self-pay | Admitting: Oncology

## 2020-06-01 NOTE — Progress Notes (Signed)
[place]  [phone] [phone]  ID: Clint Guy OB: September 14, 1981  MR#: [id number]  NOB#:[id number]  Patient Care Team: Dion Body, MD as PCP - General (Family Medicine) Lloyd Huger, MD as Consulting Physician (Oncology)  I connected with Clint Guy on 06/06/20 at  2:45 PM EDT by video enabled telemedicine visit and verified that I am speaking with the correct person using two identifiers.   I discussed the limitations, risks, security and privacy concerns of performing an evaluation and management service by telemedicine and the availability of in-person appointments. I also discussed with the patient that there may be a patient responsible charge related to this service. The patient expressed understanding and agreed to proceed.   Other persons participating in the visit and their role in the encounter: Patient, MD.  Patient's location: Home. Provider's location: Clinic.  CHIEF COMPLAINT: Leukopenia.  INTERVAL HISTORY: Patient agreed to video assisted telemedicine visit for further evaluation and discussion of her laboratory results.  She has now fully recovered from her Covid infection and is back to her baseline.  She currently feels well and is asymptomatic.  She denies any fevers.  She had no neurologic complaints.  She has a good appetite and denies weight loss.  She has no chest pain, shortness of breath, cough, or hemoptysis.  She denies any nausea, vomiting, constipation, or diarrhea.  She has no urinary complaints.  Patient offers no specific complaints today.  REVIEW OF SYSTEMS:   Review of Systems  Constitutional: Negative.  Negative for diaphoresis, fever, malaise/fatigue and weight loss.  Respiratory: Negative.  Negative for cough and shortness of breath.   Cardiovascular: Negative.  Negative for chest pain and leg swelling.  Gastrointestinal: Negative.  Negative for abdominal pain.  Genitourinary: Negative.   Negative for dysuria.  Musculoskeletal: Negative.  Negative for back pain.  Skin: Negative.  Negative for rash.  Neurological: Negative.  Negative for dizziness, focal weakness, weakness and headaches.  Psychiatric/Behavioral: Negative.  The patient is not nervous/anxious.     As per HPI. Otherwise, a complete review of systems is negative.  PAST MEDICAL HISTORY: Past Medical History:  Diagnosis Date  . Acne   . Anxiety   . Bladder dysfunction   . Endometriosis   . GERD (gastroesophageal reflux disease)   . Hemorrhoid   . Hirsutism   . History of ovarian cyst   . 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 Mastodynia   . Status post vaginal hysterectomy     PAST SURGICAL HISTORY: Past Surgical History:  Procedure Laterality Date  . BILATERAL TUBAL CATERY    . LAPAROSCOPY  10/2011   WITH EXCISION AND FULGURATION OF ENDOMETRIOSIS  . operative L/S with Left Salpingectomy  2005   Ectopic  . TVH     02/23/2012    FAMILY HISTORY: Family History  Problem Relation Age of Onset  . Cancer Neg Hx   . Diabetes Neg Hx   . Heart disease Neg Hx     ADVANCED DIRECTIVES (Y/N):  N  HEALTH MAINTENANCE: Social History   Tobacco Use  . Smoking status: Never Smoker  . Smokeless tobacco: Never Used  Substance Use Topics  . Alcohol use: Yes    Comment: occas  . Drug use: No     Colonoscopy:  PAP:  Bone density:  Lipid panel:  Allergies  Allergen Reactions  . Citalopram Other (See Comments)  . Topiramate Other (See Comments)    sleepy  .  Augmentin [Amoxicillin-Pot Clavulanate] Rash    Current Outpatient Medications  Medication Sig Dispense Refill  . omeprazole (PRILOSEC) 20 MG capsule Take 20 mg by mouth daily.    . ondansetron (ZOFRAN ODT) 4 MG disintegrating tablet Take 1 tablet (4 mg total) by mouth every 8 (eight) hours as needed for nausea or vomiting. (Patient not taking: Reported on 06/05/2020) 20 tablet 0  . potassium chloride (KLOR-CON) 10 MEQ tablet Take  1 tablet (10 mEq total) by mouth 2 (two) times daily for 3 days. 6 tablet 0  . promethazine (PHENERGAN) 25 MG suppository Place 1 suppository (25 mg total) rectally every 8 (eight) hours as needed for refractory nausea / vomiting. (Patient not taking: Reported on 06/05/2020) 12 suppository 0  . Thiamine HCl (VITAMIN B-1) 250 MG tablet Take 250 mg by mouth daily.     No current facility-administered medications for this visit.    OBJECTIVE: There were no vitals filed for this visit.   There is no height or weight on file to calculate BMI.    ECOG FS:0 - Asymptomatic  General: Well-developed, well-nourished, no acute distress. HEENT: Normocephalic. Neuro: Alert, answering all questions appropriately. Cranial nerves grossly intact. Psych: Normal affect.  LAB RESULTS:  Lab Results  Component Value Date   NA 136 02/07/2020   K 2.9 (L) 02/07/2020   CL 100 02/07/2020   CO2 26 02/07/2020   GLUCOSE 110 (H) 02/07/2020   BUN 9 02/07/2020   CREATININE 0.71 02/07/2020   CALCIUM 8.6 (L) 02/07/2020   GFRNONAA >60 02/07/2020   GFRAA >60 02/07/2020    Lab Results  Component Value Date   WBC 3.5 (L) 02/07/2020   NEUTROABS 1.4 (L) 01/13/2020   HGB 13.2 02/07/2020   HCT 38.7 02/07/2020   MCV 90.4 02/07/2020   PLT 163 02/07/2020     STUDIES: MM 3D SCREEN BREAST BILATERAL  Result Date: 05/08/2020 CLINICAL DATA:  Screening. EXAM: DIGITAL SCREENING BILATERAL MAMMOGRAM WITH TOMO AND CAD COMPARISON:  None. ACR Breast Density Category c: The breast tissue is heterogeneously dense, which may obscure small masses FINDINGS: There are no findings suspicious for malignancy. Images were processed with CAD. IMPRESSION: No mammographic evidence of malignancy. A result letter of this screening mammogram will be mailed directly to the patient. RECOMMENDATION: Screening mammogram in one year. (Code:SM-B-01Y) BI-RADS CATEGORY  1: Negative. Electronically Signed   By: Kristopher Oppenheim M.D.   On: 05/08/2020 12:50     ASSESSMENT: Leukopenia.  PLAN:   1.  Leukopenia: Patient's most recent blood work from an outside facility on May 31, 2020 revealed a WBC count of 2.9 and ANC of 1.0.  These continue to be approximately her baseline.  Previously, the remainder of blood work including iron stores, B12, folate, peripheral blood flow cytometry, and neutrophil antibodies are all either negative or within normal limits.  No intervention is needed at this time.  Patient does not require bone marrow biopsy.  After lengthy discussion with the patient, is agreed upon that no further follow-up is necessary.  Please refer patient back if there are any questions or concerns. 2.  Covid positivity: Patient reports she is now fully recovered.  I provided 20 minutes of face-to-face video visit time during this encounter which included chart review, counseling, and coordination of care as documented above.  Patient expressed understanding and was in agreement with this plan. She also understands that She can call clinic at any time with any questions, concerns, or complaints.    Kathlene November  Grayland Ormond, MD   06/06/2020 2:42 PM

## 2020-06-05 ENCOUNTER — Inpatient Hospital Stay: Payer: Managed Care, Other (non HMO) | Attending: Oncology | Admitting: Oncology

## 2020-06-05 DIAGNOSIS — D72819 Decreased white blood cell count, unspecified: Secondary | ICD-10-CM | POA: Diagnosis not present

## 2021-01-29 ENCOUNTER — Other Ambulatory Visit: Payer: Self-pay | Admitting: Internal Medicine

## 2021-01-29 ENCOUNTER — Ambulatory Visit: Payer: Managed Care, Other (non HMO) | Attending: Internal Medicine

## 2021-01-29 DIAGNOSIS — Z23 Encounter for immunization: Secondary | ICD-10-CM

## 2021-01-29 NOTE — Progress Notes (Signed)
   Covid-19 Vaccination Clinic  Name:  Tammy Lee    MRN: [id number] DOB: 12/19/1981  01/29/2021  Ms. Maiolo was observed post Covid-19 immunization for 15 minutes without incident. She was provided with Vaccine Information Sheet and instruction to access the V-Safe system.   Ms. Heffler was instructed to call 911 with any severe reactions post vaccine: Marland Kitchen Difficulty breathing  . Swelling of face and throat  . A fast heartbeat  . A bad rash all over body  . Dizziness and weakness   Immunizations Administered    Name Date Dose VIS Date Route   PFIZER Comrnaty(Gray TOP) Covid-19 Vaccine 01/29/2021  2:30 PM 0.3 mL 12/06/2020 Intramuscular   Manufacturer: [employer], [employer]   Lot: [id number]   NDC: [phone]

## 2021-04-05 ENCOUNTER — Other Ambulatory Visit: Payer: Self-pay | Admitting: Obstetrics & Gynecology

## 2021-04-05 DIAGNOSIS — Z1231 Encounter for screening mammogram for malignant neoplasm of breast: Secondary | ICD-10-CM

## 2021-05-13 ENCOUNTER — Ambulatory Visit
Admission: RE | Admit: 2021-05-13 | Discharge: 2021-05-13 | Disposition: A | Discharge status: Home / Self Care | Payer: Managed Care, Other (non HMO) | Source: Ambulatory Visit | Attending: Obstetrics & Gynecology | Admitting: Obstetrics & Gynecology

## 2021-05-13 ENCOUNTER — Other Ambulatory Visit: Payer: Self-pay

## 2021-05-13 DIAGNOSIS — Z1231 Encounter for screening mammogram for malignant neoplasm of breast: Secondary | ICD-10-CM | POA: Diagnosis present

## 2022-01-17 ENCOUNTER — Other Ambulatory Visit: Payer: Self-pay | Admitting: Gastroenterology

## 2022-01-17 DIAGNOSIS — R112 Nausea with vomiting, unspecified: Secondary | ICD-10-CM

## 2022-01-17 DIAGNOSIS — K219 Gastro-esophageal reflux disease without esophagitis: Secondary | ICD-10-CM

## 2022-01-17 DIAGNOSIS — R1011 Right upper quadrant pain: Secondary | ICD-10-CM

## 2022-01-23 ENCOUNTER — Other Ambulatory Visit: Payer: Self-pay | Admitting: Gastroenterology

## 2022-01-23 ENCOUNTER — Ambulatory Visit: Payer: Managed Care, Other (non HMO)

## 2022-01-23 DIAGNOSIS — K219 Gastro-esophageal reflux disease without esophagitis: Secondary | ICD-10-CM

## 2022-01-23 DIAGNOSIS — R1011 Right upper quadrant pain: Secondary | ICD-10-CM

## 2022-01-23 DIAGNOSIS — R112 Nausea with vomiting, unspecified: Secondary | ICD-10-CM

## 2022-01-27 ENCOUNTER — Other Ambulatory Visit: Payer: Self-pay | Admitting: Gastroenterology

## 2022-01-27 DIAGNOSIS — K219 Gastro-esophageal reflux disease without esophagitis: Secondary | ICD-10-CM

## 2022-01-27 DIAGNOSIS — R112 Nausea with vomiting, unspecified: Secondary | ICD-10-CM

## 2022-01-27 DIAGNOSIS — R1011 Right upper quadrant pain: Secondary | ICD-10-CM

## 2022-01-29 ENCOUNTER — Ambulatory Visit
Admission: RE | Admit: 2022-01-29 | Discharge: 2022-01-29 | Disposition: A | Discharge status: Home / Self Care | Payer: Managed Care, Other (non HMO) | Source: Ambulatory Visit | Attending: Gastroenterology | Admitting: Gastroenterology

## 2022-01-29 DIAGNOSIS — K219 Gastro-esophageal reflux disease without esophagitis: Secondary | ICD-10-CM

## 2022-01-29 DIAGNOSIS — R112 Nausea with vomiting, unspecified: Secondary | ICD-10-CM

## 2022-01-29 DIAGNOSIS — R1011 Right upper quadrant pain: Secondary | ICD-10-CM

## 2022-03-31 ENCOUNTER — Other Ambulatory Visit: Payer: Self-pay | Admitting: Obstetrics and Gynecology

## 2022-03-31 DIAGNOSIS — Z1231 Encounter for screening mammogram for malignant neoplasm of breast: Secondary | ICD-10-CM

## 2022-05-14 ENCOUNTER — Ambulatory Visit
Admission: RE | Admit: 2022-05-14 | Discharge: 2022-05-14 | Disposition: A | Discharge status: Home / Self Care | Payer: Managed Care, Other (non HMO) | Source: Ambulatory Visit | Attending: Obstetrics and Gynecology | Admitting: Obstetrics and Gynecology

## 2022-05-14 DIAGNOSIS — Z1231 Encounter for screening mammogram for malignant neoplasm of breast: Secondary | ICD-10-CM | POA: Insufficient documentation

## 2022-10-07 ENCOUNTER — Emergency Department
Admission: EM | Admit: 2022-10-07 | Discharge: 2022-10-07 | Disposition: A | Discharge status: Home / Self Care | Payer: Managed Care, Other (non HMO) | Attending: Emergency Medicine | Admitting: Emergency Medicine

## 2022-10-07 ENCOUNTER — Emergency Department: Payer: Managed Care, Other (non HMO)

## 2022-10-07 ENCOUNTER — Encounter: Payer: Self-pay | Admitting: Emergency Medicine

## 2022-10-07 ENCOUNTER — Other Ambulatory Visit: Payer: Self-pay

## 2022-10-07 DIAGNOSIS — K219 Gastro-esophageal reflux disease without esophagitis: Secondary | ICD-10-CM | POA: Insufficient documentation

## 2022-10-07 DIAGNOSIS — K8020 Calculus of gallbladder without cholecystitis without obstruction: Secondary | ICD-10-CM | POA: Diagnosis not present

## 2022-10-07 DIAGNOSIS — R1011 Right upper quadrant pain: Secondary | ICD-10-CM | POA: Diagnosis present

## 2022-10-07 DIAGNOSIS — K8050 Calculus of bile duct without cholangitis or cholecystitis without obstruction: Secondary | ICD-10-CM

## 2022-10-07 LAB — URINALYSIS, ROUTINE W REFLEX MICROSCOPIC
Bacteria, UA: NONE SEEN
Bilirubin Urine: NEGATIVE
Glucose, UA: NEGATIVE mg/dL
Hgb urine dipstick: NEGATIVE
Ketones, ur: NEGATIVE mg/dL
Leukocytes,Ua: NEGATIVE
Nitrite: NEGATIVE
Protein, ur: 30 mg/dL — AB
Specific Gravity, Urine: 1.027 (ref 1.005–1.030)
pH: 5 (ref 5.0–8.0)

## 2022-10-07 LAB — COMPREHENSIVE METABOLIC PANEL
ALT: 493 U/L — ABNORMAL HIGH (ref 0–44)
AST: 202 U/L — ABNORMAL HIGH (ref 15–41)
Albumin: 4.1 g/dL (ref 3.5–5.0)
Alkaline Phosphatase: 153 U/L — ABNORMAL HIGH (ref 38–126)
Anion gap: 7 (ref 5–15)
BUN: 10 mg/dL (ref 6–20)
CO2: 27 mmol/L (ref 22–32)
Calcium: 9.3 mg/dL (ref 8.9–10.3)
Chloride: 104 mmol/L (ref 98–111)
Creatinine, Ser: 0.83 mg/dL (ref 0.44–1.00)
GFR, Estimated: >60 mL/min (ref >60)
Glucose, Bld: 109 mg/dL — ABNORMAL HIGH (ref 70–99)
Potassium: 3.2 mmol/L — ABNORMAL LOW (ref 3.5–5.1)
Sodium: 138 mmol/L (ref 135–145)
Total Bilirubin: 1.2 mg/dL (ref 0.3–1.2)
Total Protein: 8.8 g/dL — ABNORMAL HIGH (ref 6.5–8.1)

## 2022-10-07 LAB — CBC
HCT: 37 % (ref 36.0–46.0)
Hemoglobin: 12.6 g/dL (ref 12.0–15.0)
MCH: 32.1 pg (ref 26.0–34.0)
MCHC: 34.1 g/dL (ref 30.0–36.0)
MCV: 94.1 fL (ref 80.0–100.0)
Platelets: 231 10*3/uL (ref 150–400)
RBC: 3.93 MIL/uL (ref 3.87–5.11)
RDW: 12.1 % (ref 11.5–15.5)
WBC: 4.4 10*3/uL (ref 4.0–10.5)
nRBC: 0 % (ref 0.0–0.2)

## 2022-10-07 LAB — POC URINE PREG, ED: Preg Test, Ur: NEGATIVE

## 2022-10-07 LAB — LIPASE, BLOOD: Lipase: 35 U/L (ref 11–51)

## 2022-10-07 MED ORDER — POTASSIUM CHLORIDE CRYS ER 20 MEQ PO TBCR
40.0000 meq | EXTENDED_RELEASE_TABLET | Freq: Once | ORAL | Status: AC
  Administered 2022-10-07: 40 meq via ORAL
  Filled 2022-10-07: qty 2

## 2022-10-07 MED ORDER — GADOBUTROL 1 MMOL/ML IV SOLN
7.0000 mL | Freq: Once | INTRAVENOUS | Status: AC | PRN
  Administered 2022-10-07: 7 mL via INTRAVENOUS

## 2022-10-07 NOTE — ED Provider Triage Note (Signed)
Emergency Medicine Provider Triage Evaluation Note  Tammy Lee , a 41 y.o. female  was evaluated in triage.  Pt complains of RUQ pain.  Initially began in January.  Has been seen by GI and ultrasound of the gallbladder was negative.  Review of Systems  Positive: Nausea,  Negative: No urinary sx, vomiting or diarrhea.   Physical Exam  BP (!) 151/97 (BP Location: Left Arm)   Pulse 78   Temp 98.2 F (36.8 C) (Oral)   Resp 16   Ht 5\' 2"  (1.575 m)   Wt 74.8 kg   SpO2 97%   BMI 30.16 kg/m  Gen:   Awake, no distress   Resp:  Normal effort  MSK:   Moves extremities without difficulty  Other:  No point tenderness to palpate Abdomen.   Medical Decision Making  Medically screening exam initiated at 2:50 PM.  Appropriate orders placed.  Tammy Lee was informed that the remainder of the evaluation will be completed by another provider, this initial triage assessment does not replace that evaluation, and the importance of remaining in the ED until their evaluation is complete.     Johnn Hai, PA-C 10/07/22 1451

## 2022-10-07 NOTE — ED Triage Notes (Signed)
C/O right sided abdominal pain.  Onset January.  Has been seen by GI for same.  Has had US done, negative Gallbladder.  Has been taking tums with relief, but current episode started Sunday, tums not relieving symptoms.

## 2022-10-07 NOTE — ED Provider Notes (Signed)
[facility] Provider Note    Event Date/Time   First MD Initiated Contact with Patient 10/07/22 1700     (approximate)   History   Chief Complaint Abdominal Pain   HPI  Tammy Lee is a 41 y.o. female with past medical history of GERD and endometriosis who presents to the ED complaining of abdominal pain.  Patient reports that she has been dealing with intermittent episodes of pain in the right upper quadrant of her abdomen for the past 8 months.  She describes the pain as "like a contraction" and that seems to primarily come on after she eats.  After an initial episode in February, she was seen by GI for this and had an ultrasound performed.  She reports not having the ultrasound results until yesterday, when she was told there was sludge in the gallbladder.  She has been having intermittent episodes of pain typically after eating, but usually alleviated with Tums.  She had a flareup of pain in the past 2 days that has not been alleviated by Tums.  She has been feeling nauseous but has not vomited, denies any fevers.  She denies any changes in her bowel movements and has not had any dysuria, hematuria, vaginal bleeding or discharge.  She is status post hysterectomy.  Pain is currently improved following flareup after lunch today.     Physical Exam   Triage Vital Signs: ED Triage Vitals [10/07/22 1447]  Enc Vitals Group     BP (!) 151/97     Pulse Rate 78     Resp 16     Temp 98.2 F (36.8 C)     Temp Source Oral     SpO2 97 %     Weight 164 lb 14.5 oz (74.8 kg)     Height 5\' 2"  (1.575 m)     Head Circumference      Peak Flow      Pain Score 9     Pain Loc      Pain Edu?      Excl. in [place]?     Most recent vital signs: Vitals:   10/07/22 2245 10/07/22 2246  BP: 108/78   Pulse: (!) 105 79  Resp:    Temp:    SpO2:  98%    Constitutional: Alert and oriented. Eyes: Conjunctivae are normal. Head: Atraumatic. Nose: No  congestion/rhinnorhea. Mouth/Throat: Mucous membranes are moist.  Cardiovascular: Normal rate, regular rhythm. Grossly normal heart sounds.  2+ radial pulses bilaterally. Respiratory: Normal respiratory effort.  No retractions. Lungs CTAB. Gastrointestinal: Soft and nontender. No distention. Musculoskeletal: No lower extremity tenderness nor edema.  Neurologic:  Normal speech and language. No gross focal neurologic deficits are appreciated.    ED Results / Procedures / Treatments   Labs (all labs ordered are listed, but only abnormal results are displayed) Labs Reviewed  COMPREHENSIVE METABOLIC PANEL - Abnormal; Notable for the following components:      Result Value   Potassium 3.2 (*)    Glucose, Bld 109 (*)    Total Protein 8.8 (*)    AST 202 (*)    ALT 493 (*)    Alkaline Phosphatase 153 (*)    All other components within normal limits  URINALYSIS, ROUTINE W REFLEX MICROSCOPIC - Abnormal; Notable for the following components:   Color, Urine YELLOW (*)    APPearance CLEAR (*)    Protein, ur 30 (*)    All other components within normal limits  LIPASE, BLOOD  CBC  POC URINE PREG, ED    RADIOLOGY Right upper quadrant ultrasound reviewed and interpreted by me with mild wall thickening, no pericholecystic fluid noted, mildly enlarged CBD noted.  PROCEDURES:  Critical Care performed: No  Procedures   MEDICATIONS ORDERED IN ED: Medications  potassium chloride SA (KLOR-CON M) CR tablet 40 mEq (40 mEq Oral Given 10/07/22 1928)  gadobutrol (GADAVIST) 1 MMOL/ML injection 7 mL (7 mLs Intravenous Contrast Given 10/07/22 2158)     IMPRESSION / MDM / ASSESSMENT AND PLAN / ED COURSE  I reviewed the triage vital signs and the nursing notes.                              41 y.o. female with past medical history of GERD and endometriosis who presents to the ED complaining of intermittent right upper quadrant abdominal pain for the past 8 months that has been worse over the past  2 days.  Patient's presentation is most consistent with acute presentation with potential threat to life or bodily function.  Differential diagnosis includes, but is not limited to, biliary colic, cholecystitis, choledocholithiasis, hepatitis, pancreatitis, kidney stone, GERD.  Patient nontoxic-appearing and in no acute distress, vital signs are unremarkable.  Her abdominal exam is currently benign and she states pain improved compared to earlier today.  Labs do show significant transaminitis with ALT greater than AST, alkaline phosphatase mildly elevated but bilirubin within normal limits.  Lipase within normal limits, renal function and electrolytes are also reassuring.  Symptoms seem most consistent with biliary pathology and we will further assess with right upper quadrant ultrasound, gallbladder inflammation may be contributing to her transaminitis.  Lower suspicion for choledocholithiasis given bilirubin is reassuring.  Remainder of labs show no anemia or leukocytosis, urinalysis shows no signs of infection.  Patient declines pain or nausea medication at this time.  Right upper quadrant ultrasound shows no inflammatory changes of the gallbladder, does show mild sludge with mildly dilated common bile duct.  Case reviewed with Dr. Servando Snare, who does not feel patient needs MRCP as choledocholithiasis unlikely with normal bilirubin and relatively low alkaline phosphatase.  He recommends outpatient follow-up with GI or patient's PCP for recheck of liver enzymes.  Patient does confirm that she has not been taking any Tylenol or other hepatotoxic medication, has only been taking Tums.  Case discussed with Dr. Everlene Farrier of general surgery, who does recommend that we obtain MRCP, states patient may follow-up outpatient if this is unremarkable.  Patient continues to have minimal pain at this time, is agreeable to stay for MRCP.  MRCP shows cholelithiasis with no evidence of cholecystitis, there is mild dilatation of  the CBD but this smoothly tapers to the ampulla with no evidence of choledocholithiasis.  On reassessment, patient continues to deny significant pain and is appropriate for discharge home with outpatient general surgery follow-up.  She was provided with referral and declines additional pain or nausea medication.  She was also advised to follow-up with GI or her PCP for recheck of LFTs.  She was counseled to return to the ED for new or worsening symptoms, patient agrees with plan.      FINAL CLINICAL IMPRESSION(S) / ED DIAGNOSES   Final diagnoses:  Biliary colic     Rx / DC Orders   ED Discharge Orders     None        Note:  This document was prepared using Dragon voice  recognition software and may include unintentional dictation errors.   Chesley Noon, MD 10/07/22 2314

## 2022-10-09 ENCOUNTER — Encounter
Admission: RE | Admit: 2022-10-09 | Discharge: 2022-10-09 | Disposition: A | Discharge status: Home / Self Care | Payer: Managed Care, Other (non HMO) | Source: Ambulatory Visit | Attending: General Surgery | Admitting: General Surgery

## 2022-10-09 ENCOUNTER — Other Ambulatory Visit: Payer: Self-pay

## 2022-10-09 ENCOUNTER — Ambulatory Visit: Payer: Self-pay | Admitting: General Surgery

## 2022-10-09 VITALS — Ht 62.0 in | Wt 167.0 lb

## 2022-10-09 DIAGNOSIS — E876 Hypokalemia: Secondary | ICD-10-CM

## 2022-10-09 MED ORDER — LACTATED RINGERS IV SOLN: INTRAVENOUS | Status: DC

## 2022-10-09 MED ORDER — ORAL CARE MOUTH RINSE: 15.0000 mL | Freq: Once | OROMUCOSAL | Status: AC

## 2022-10-09 MED ORDER — INDOCYANINE GREEN 25 MG IV SOLR
1.2500 mg | Freq: Once | INTRAVENOUS | Status: AC
  Administered 2022-10-10: 1.25 mg via INTRAVENOUS
  Filled 2022-10-09: qty 0.5

## 2022-10-09 MED ORDER — CEFAZOLIN SODIUM-DEXTROSE 2-4 GM/100ML-% IV SOLN
2.0000 g | INTRAVENOUS | Status: AC
  Administered 2022-10-10: 2 g via INTRAVENOUS

## 2022-10-09 MED ORDER — CHLORHEXIDINE GLUCONATE 0.12 % MT SOLN: 15.0000 mL | Freq: Once | OROMUCOSAL | Status: AC

## 2022-10-09 NOTE — Patient Instructions (Signed)
Your procedure is scheduled on: 10/10/22 Report to [place]. To find out your arrival time please call [phone] between 1PM - 3PM on 10/09/22.  Remember: Instructions that are not followed completely may result in serious medical risk, up to and including death, or upon the discretion of your surgeon and anesthesiologist your surgery may need to be rescheduled.     _X__ 1. Do not eat food or drink any liquids after midnight the night before your procedure.                 No gum chewing or hard candies.   __X__2.  On the morning of surgery brush your teeth with toothpaste and water, you                 may rinse your mouth with mouthwash if you wish.  Do not swallow any              toothpaste of mouthwash.     _X__ 3.  No Alcohol for 24 hours before or after surgery.   _X__ 4.  Do Not Smoke or use e-cigarettes For 24 Hours Prior to Your Surgery.                 Do not use any chewable tobacco products for at least 6 hours prior to                 surgery.  ____  5.  Bring all medications with you on the day of surgery if instructed.   __X__  6.  Notify your doctor if there is any change in your medical condition      (cold, fever, infections).     Do not wear jewelry, make-up, hairpins, clips or nail polish. Do not wear lotions, powders, or perfumes. You may wear deodorant Do not shave body hair 48 hours prior to surgery. Men may shave face and neck. Do not bring valuables to the hospital.    [facility] is not responsible for any belongings or valuables.  Contacts, dentures/partials or body piercings may not be worn into surgery. Bring a case for your contacts, glasses or hearing aids, a denture cup will be supplied. Leave your suitcase in the car. After surgery it may be brought to your room. For patients admitted to the hospital, discharge time is determined by your treatment team.   Patients discharged the day of  surgery will not be allowed to drive home.    __X__ Take these medicines the morning of surgery with A SIP OF WATER:    1. omeprazole (PRILOSEC) 20 MG capsule  2.   3.   4.  5.  6.  ____ Fleet Enema (as directed)   ____ Use CHG Soap/SAGE wipes as directed  ____ Use inhalers on the day of surgery  ____ Stop metformin/Janumet/Farxiga 2 days prior to surgery    ____ Take 1/2 of usual insulin dose the night before surgery. No insulin the morning          of surgery.   ____ Stop Blood Thinners Coumadin/Plavix/Xarelto/Pleta/Pradaxa/Eliquis/Effient/Aspirin  on   Or contact your Surgeon, Cardiologist or Medical Doctor regarding  ability to stop your blood thinners  __X__ Stop Anti-inflammatories 7 days before surgery such as Advil, Ibuprofen, Motrin,  BC or Goodies Powder, Naprosyn, Naproxen, Aleve, Aspirin   You may take Tylenol if needed  __X__ Stop all herbals and supplements, fish oil or vitamins  until after surgery.    ____ Bring C-Pap to the hospital.

## 2022-10-10 ENCOUNTER — Encounter
Admission: RE | Disposition: A | Discharge status: Home / Self Care | Payer: Self-pay | Source: Home / Self Care | Attending: General Surgery

## 2022-10-10 ENCOUNTER — Ambulatory Visit: Payer: Managed Care, Other (non HMO) | Admitting: Urgent Care

## 2022-10-10 ENCOUNTER — Other Ambulatory Visit: Payer: Self-pay

## 2022-10-10 ENCOUNTER — Ambulatory Visit: Payer: Self-pay | Admitting: General Surgery

## 2022-10-10 ENCOUNTER — Ambulatory Visit
Admission: RE | Admit: 2022-10-10 | Discharge: 2022-10-10 | Disposition: A | Discharge status: Home / Self Care | Payer: Managed Care, Other (non HMO) | Attending: General Surgery | Admitting: General Surgery

## 2022-10-10 ENCOUNTER — Encounter: Payer: Self-pay | Admitting: General Surgery

## 2022-10-10 DIAGNOSIS — K8010 Calculus of gallbladder with chronic cholecystitis without obstruction: Secondary | ICD-10-CM | POA: Diagnosis present

## 2022-10-10 DIAGNOSIS — E876 Hypokalemia: Secondary | ICD-10-CM

## 2022-10-10 LAB — POCT I-STAT, CHEM 8
BUN: 10 mg/dL (ref 6–20)
Calcium, Ion: 1.19 mmol/L (ref 1.15–1.40)
Chloride: 102 mmol/L (ref 98–111)
Creatinine, Ser: 0.7 mg/dL (ref 0.44–1.00)
Glucose, Bld: 99 mg/dL (ref 70–99)
HCT: 40 % (ref 36.0–46.0)
Hemoglobin: 13.6 g/dL (ref 12.0–15.0)
Potassium: 3.5 mmol/L (ref 3.5–5.1)
Sodium: 140 mmol/L (ref 135–145)
TCO2: 28 mmol/L (ref 22–32)

## 2022-10-10 SURGERY — CHOLECYSTECTOMY, ROBOT-ASSISTED, LAPAROSCOPIC
Anesthesia: General | Site: Abdomen

## 2022-10-10 MED ORDER — SEVOFLURANE IN SOLN
RESPIRATORY_TRACT | Status: AC
  Filled 2022-10-10: qty 250

## 2022-10-10 MED ORDER — ONDANSETRON HCL 4 MG/2ML IJ SOLN
INTRAMUSCULAR | Status: AC
  Filled 2022-10-10: qty 18

## 2022-10-10 MED ORDER — SUCCINYLCHOLINE CHLORIDE 200 MG/10ML IV SOSY
PREFILLED_SYRINGE | INTRAVENOUS | Status: AC
  Filled 2022-10-10: qty 10

## 2022-10-10 MED ORDER — SUCCINYLCHOLINE CHLORIDE 200 MG/10ML IV SOSY
PREFILLED_SYRINGE | INTRAVENOUS | Status: DC | PRN
  Administered 2022-10-10: 80 mg via INTRAVENOUS

## 2022-10-10 MED ORDER — HYDROMORPHONE HCL 1 MG/ML IJ SOLN
INTRAMUSCULAR | Status: AC
  Filled 2022-10-10: qty 1

## 2022-10-10 MED ORDER — ROCURONIUM BROMIDE 10 MG/ML (PF) SYRINGE
PREFILLED_SYRINGE | INTRAVENOUS | Status: AC
  Filled 2022-10-10: qty 20

## 2022-10-10 MED ORDER — LACTATED RINGERS IV SOLN: INTRAVENOUS | Status: DC | PRN

## 2022-10-10 MED ORDER — ACETAMINOPHEN 10 MG/ML IV SOLN: 1000.0000 mg | Freq: Once | INTRAVENOUS | Status: DC | PRN

## 2022-10-10 MED ORDER — FENTANYL CITRATE (PF) 100 MCG/2ML IJ SOLN
INTRAMUSCULAR | Status: AC
  Administered 2022-10-10: 25 ug via INTRAVENOUS
  Filled 2022-10-10: qty 2

## 2022-10-10 MED ORDER — LIDOCAINE HCL (PF) 2 % IJ SOLN
INTRAMUSCULAR | Status: AC
  Filled 2022-10-10: qty 5

## 2022-10-10 MED ORDER — MIDAZOLAM HCL 2 MG/2ML IJ SOLN
INTRAMUSCULAR | Status: DC | PRN
  Administered 2022-10-10: 2 mg via INTRAVENOUS

## 2022-10-10 MED ORDER — PHENYLEPHRINE 80 MCG/ML (10ML) SYRINGE FOR IV PUSH (FOR BLOOD PRESSURE SUPPORT)
PREFILLED_SYRINGE | INTRAVENOUS | Status: AC
  Filled 2022-10-10: qty 40

## 2022-10-10 MED ORDER — PROPOFOL 10 MG/ML IV BOLUS
INTRAVENOUS | Status: AC
  Filled 2022-10-10: qty 20

## 2022-10-10 MED ORDER — 0.9 % SODIUM CHLORIDE (POUR BTL) OPTIME
TOPICAL | Status: DC | PRN
  Administered 2022-10-10: 500 mL

## 2022-10-10 MED ORDER — BUPIVACAINE-EPINEPHRINE 0.25% -1:200000 IJ SOLN
INTRAMUSCULAR | Status: DC | PRN
  Administered 2022-10-10: 30 mL

## 2022-10-10 MED ORDER — ONDANSETRON HCL 4 MG/2ML IJ SOLN
INTRAMUSCULAR | Status: AC
  Filled 2022-10-10: qty 2

## 2022-10-10 MED ORDER — ONDANSETRON HCL 4 MG/2ML IJ SOLN: 4.0000 mg | Freq: Once | INTRAMUSCULAR | Status: DC | PRN

## 2022-10-10 MED ORDER — OXYCODONE HCL 5 MG PO TABS
ORAL_TABLET | ORAL | Status: AC
  Filled 2022-10-10: qty 1

## 2022-10-10 MED ORDER — PHENYLEPHRINE HCL (PRESSORS) 10 MG/ML IV SOLN
INTRAVENOUS | Status: DC | PRN
  Administered 2022-10-10: 80 ug via INTRAVENOUS

## 2022-10-10 MED ORDER — LIDOCAINE HCL (CARDIAC) PF 100 MG/5ML IV SOSY
PREFILLED_SYRINGE | INTRAVENOUS | Status: DC | PRN
  Administered 2022-10-10: 60 mg via INTRAVENOUS

## 2022-10-10 MED ORDER — EPHEDRINE 5 MG/ML INJ
INTRAVENOUS | Status: AC
  Filled 2022-10-10: qty 25

## 2022-10-10 MED ORDER — LACTATED RINGERS IV SOLN: INTRAVENOUS | Status: DC

## 2022-10-10 MED ORDER — MIDAZOLAM HCL 2 MG/2ML IJ SOLN
INTRAMUSCULAR | Status: AC
  Filled 2022-10-10: qty 2

## 2022-10-10 MED ORDER — CEFAZOLIN SODIUM-DEXTROSE 2-4 GM/100ML-% IV SOLN
INTRAVENOUS | Status: AC
  Filled 2022-10-10: qty 100

## 2022-10-10 MED ORDER — ACETAMINOPHEN 10 MG/ML IV SOLN
INTRAVENOUS | Status: DC | PRN
  Administered 2022-10-10: 1000 mg via INTRAVENOUS

## 2022-10-10 MED ORDER — FENTANYL CITRATE (PF) 100 MCG/2ML IJ SOLN
INTRAMUSCULAR | Status: AC
  Filled 2022-10-10: qty 2

## 2022-10-10 MED ORDER — DEXAMETHASONE SODIUM PHOSPHATE 10 MG/ML IJ SOLN
INTRAMUSCULAR | Status: AC
  Filled 2022-10-10: qty 5

## 2022-10-10 MED ORDER — HYDROMORPHONE HCL 1 MG/ML IJ SOLN
INTRAMUSCULAR | Status: DC | PRN
  Administered 2022-10-10: .5 mg via INTRAVENOUS

## 2022-10-10 MED ORDER — SUCCINYLCHOLINE CHLORIDE 200 MG/10ML IV SOSY
PREFILLED_SYRINGE | INTRAVENOUS | Status: AC
  Filled 2022-10-10: qty 40

## 2022-10-10 MED ORDER — PROPOFOL 10 MG/ML IV BOLUS
INTRAVENOUS | Status: DC | PRN
  Administered 2022-10-10: 150 mg via INTRAVENOUS
  Administered 2022-10-10: 50 mg via INTRAVENOUS

## 2022-10-10 MED ORDER — ONDANSETRON HCL 4 MG/2ML IJ SOLN
INTRAMUSCULAR | Status: DC | PRN
  Administered 2022-10-10: 4 mg via INTRAVENOUS

## 2022-10-10 MED ORDER — FENTANYL CITRATE (PF) 100 MCG/2ML IJ SOLN
INTRAMUSCULAR | Status: DC | PRN
  Administered 2022-10-10 (×2): 50 ug via INTRAVENOUS

## 2022-10-10 MED ORDER — HYDROCODONE-ACETAMINOPHEN 5-325 MG PO TABS: 1.0000 | ORAL_TABLET | ORAL | 0 refills | Status: AC | PRN

## 2022-10-10 MED ORDER — SUGAMMADEX SODIUM 200 MG/2ML IV SOLN
INTRAVENOUS | Status: DC | PRN
  Administered 2022-10-10: 200 mg via INTRAVENOUS

## 2022-10-10 MED ORDER — OXYCODONE HCL 5 MG PO TABS
5.0000 mg | ORAL_TABLET | Freq: Once | ORAL | Status: AC | PRN
  Administered 2022-10-10: 5 mg via ORAL

## 2022-10-10 MED ORDER — DEXAMETHASONE SODIUM PHOSPHATE 10 MG/ML IJ SOLN
INTRAMUSCULAR | Status: AC
  Filled 2022-10-10: qty 1

## 2022-10-10 MED ORDER — ACETAMINOPHEN 10 MG/ML IV SOLN
INTRAVENOUS | Status: AC
  Filled 2022-10-10: qty 100

## 2022-10-10 MED ORDER — CHLORHEXIDINE GLUCONATE 0.12 % MT SOLN
OROMUCOSAL | Status: AC
  Administered 2022-10-10: 15 mL via OROMUCOSAL
  Filled 2022-10-10: qty 15

## 2022-10-10 MED ORDER — BUPIVACAINE-EPINEPHRINE (PF) 0.25% -1:200000 IJ SOLN
INTRAMUSCULAR | Status: AC
  Filled 2022-10-10: qty 30

## 2022-10-10 MED ORDER — ROCURONIUM BROMIDE 100 MG/10ML IV SOLN
INTRAVENOUS | Status: DC | PRN
  Administered 2022-10-10: 30 mg via INTRAVENOUS

## 2022-10-10 MED ORDER — FENTANYL CITRATE (PF) 100 MCG/2ML IJ SOLN
25.0000 ug | INTRAMUSCULAR | Status: DC | PRN
  Administered 2022-10-10 (×3): 25 ug via INTRAVENOUS

## 2022-10-10 MED ORDER — LIDOCAINE HCL (PF) 2 % IJ SOLN
INTRAMUSCULAR | Status: AC
  Filled 2022-10-10: qty 30

## 2022-10-10 MED ORDER — ROCURONIUM BROMIDE 10 MG/ML (PF) SYRINGE
PREFILLED_SYRINGE | INTRAVENOUS | Status: AC
  Filled 2022-10-10: qty 40

## 2022-10-10 MED ORDER — OXYCODONE HCL 5 MG/5ML PO SOLN: 5.0000 mg | Freq: Once | ORAL | Status: AC | PRN

## 2022-10-10 MED ORDER — ROCURONIUM BROMIDE 10 MG/ML (PF) SYRINGE
PREFILLED_SYRINGE | INTRAVENOUS | Status: AC
  Filled 2022-10-10: qty 10

## 2022-10-10 SURGICAL SUPPLY — 54 items
ADH SKN CLS APL DERMABOND .7 (GAUZE/BANDAGES/DRESSINGS) ×2
BAG PRESSURE INF REUSE 1000 (BAG) IMPLANT
BLADE SURG SZ11 CARB STEEL (BLADE) ×2 IMPLANT
CANNULA REDUC XI 12-8 STAPL (CANNULA) ×2
CANNULA REDUCER 12-8 DVNC XI (CANNULA) ×2 IMPLANT
CATH REDDICK CHOLANGI 4FR 50CM (CATHETERS) IMPLANT
CLIP LIGATING HEM O LOK PURPLE (MISCELLANEOUS) IMPLANT
CLIP LIGATING HEMO O LOK GREEN (MISCELLANEOUS) ×2 IMPLANT
DERMABOND ADVANCED .7 DNX12 (GAUZE/BANDAGES/DRESSINGS) ×2 IMPLANT
DRAPE ARM DVNC X/XI (DISPOSABLE) ×8 IMPLANT
DRAPE C-ARM XRAY 36X54 (DRAPES) IMPLANT
DRAPE COLUMN DVNC XI (DISPOSABLE) ×2 IMPLANT
DRAPE DA VINCI XI ARM (DISPOSABLE) ×8
DRAPE DA VINCI XI COLUMN (DISPOSABLE) ×2
ELECT REM PT RETURN 9FT ADLT (ELECTROSURGICAL) ×2
ELECTRODE REM PT RTRN 9FT ADLT (ELECTROSURGICAL) ×2 IMPLANT
GLOVE BIO SURGEON STRL SZ 6.5 (GLOVE) ×4 IMPLANT
GLOVE BIOGEL PI IND STRL 6.5 (GLOVE) ×4 IMPLANT
GOWN STRL REUS W/ TWL LRG LVL3 (GOWN DISPOSABLE) ×6 IMPLANT
GOWN STRL REUS W/TWL LRG LVL3 (GOWN DISPOSABLE) ×6
GRASPER SUT TROCAR 14GX15 (MISCELLANEOUS) ×2 IMPLANT
IRRIGATOR SUCT 8 DISP DVNC XI (IRRIGATION / IRRIGATOR) IMPLANT
IRRIGATOR SUCTION 8MM XI DISP (IRRIGATION / IRRIGATOR)
IV CATH ANGIO 12GX3 LT BLUE (NEEDLE) IMPLANT
IV NS 1000ML (IV SOLUTION)
IV NS 1000ML BAXH (IV SOLUTION) IMPLANT
KIT PINK PAD W/HEAD ARE REST (MISCELLANEOUS) ×2
KIT PINK PAD W/HEAD ARM REST (MISCELLANEOUS) ×2 IMPLANT
LABEL OR SOLS (LABEL) ×2 IMPLANT
MANIFOLD NEPTUNE II (INSTRUMENTS) ×2 IMPLANT
NDL INSUFFLATION 14GA 120MM (NEEDLE) ×2 IMPLANT
NEEDLE HYPO 22GX1.5 SAFETY (NEEDLE) ×2 IMPLANT
NEEDLE INSUFFLATION 14GA 120MM (NEEDLE) ×2 IMPLANT
NS IRRIG 500ML POUR BTL (IV SOLUTION) ×2 IMPLANT
OBTURATOR OPTICAL STANDARD 8MM (TROCAR) ×2
OBTURATOR OPTICAL STND 8 DVNC (TROCAR) ×2
OBTURATOR OPTICALSTD 8 DVNC (TROCAR) ×2 IMPLANT
PACK LAP CHOLECYSTECTOMY (MISCELLANEOUS) ×2 IMPLANT
SEAL CANN UNIV 5-8 DVNC XI (MISCELLANEOUS) ×6 IMPLANT
SEAL XI 5MM-8MM UNIVERSAL (MISCELLANEOUS) ×6
SET TUBE SMOKE EVAC HIGH FLOW (TUBING) ×2 IMPLANT
SOLUTION ELECTROLUBE (MISCELLANEOUS) ×2 IMPLANT
SPIKE FLUID TRANSFER (MISCELLANEOUS) ×4 IMPLANT
SPONGE T-LAP 4X18 ~~LOC~~+RFID (SPONGE) IMPLANT
STAPLER CANNULA SEAL DVNC XI (STAPLE) ×2 IMPLANT
STAPLER CANNULA SEAL XI (STAPLE) ×2
SUT MNCRL 4-0 (SUTURE) ×2
SUT MNCRL 4-0 27XMFL (SUTURE) ×2
SUT VICRYL 0 AB UR-6 (SUTURE) ×2 IMPLANT
SUTURE MNCRL 4-0 27XMF (SUTURE) ×2 IMPLANT
SYS BAG RETRIEVAL 10MM (BASKET) ×2
SYSTEM BAG RETRIEVAL 10MM (BASKET) ×2 IMPLANT
TRAP FLUID SMOKE EVACUATOR (MISCELLANEOUS) ×2 IMPLANT
WATER STERILE IRR 500ML POUR (IV SOLUTION) ×2 IMPLANT

## 2022-10-10 NOTE — Anesthesia Preprocedure Evaluation (Addendum)
Anesthesia Evaluation  Patient identified by MRN, date of birth, ID band Patient awake    Reviewed: Allergy & Precautions, NPO status , Patient's Chart, lab work & pertinent test results  History of Anesthesia Complications Negative for: history of anesthetic complications  Airway Mallampati: III   Neck ROM: Full    Dental no notable dental hx.    Pulmonary neg pulmonary ROS,    Pulmonary exam normal breath sounds clear to auscultation       Cardiovascular Exercise Tolerance: Good negative cardio ROS Normal cardiovascular exam Rhythm:Regular Rate:Normal     Neuro/Psych PSYCHIATRIC DISORDERS Anxiety negative neurological ROS     GI/Hepatic GERD  ,  Endo/Other  negative endocrine ROS  Renal/GU negative Renal ROS     Musculoskeletal   Abdominal   Peds  Hematology negative hematology ROS (+)   Anesthesia Other Findings   Reproductive/Obstetrics Endometriosis                             Anesthesia Physical Anesthesia Plan  ASA: 2  Anesthesia Plan: General   Post-op Pain Management:    Induction: Intravenous  PONV Risk Score and Plan: 3 and Ondansetron, Dexamethasone and Treatment may vary due to age or medical condition  Airway Management Planned: Oral ETT  Additional Equipment:   Intra-op Plan:   Post-operative Plan: Extubation in OR  Informed Consent: I have reviewed the patients History and Physical, chart, labs and discussed the procedure including the risks, benefits and alternatives for the proposed anesthesia with the patient or authorized representative who has indicated his/her understanding and acceptance.     Dental advisory given  Plan Discussed with: CRNA  Anesthesia Plan Comments: (Patient consented for risks of anesthesia including but not limited to:  - adverse reactions to medications - damage to eyes, teeth, lips or other oral mucosa - nerve damage due  to positioning  - sore throat or hoarseness - damage to heart, brain, nerves, lungs, other parts of body or loss of life  Informed patient about role of CRNA in peri- and intra-operative care.  Patient voiced understanding.)        Anesthesia Quick Evaluation

## 2022-10-10 NOTE — Op Note (Signed)
Preoperative diagnosis: Cholelithiasis  Postoperative diagnosis: Cholelithiasis  Procedure: Robotic Assisted Laparoscopic Cholecystectomy.   Anesthesia: GETA   Surgeon: Dr. Windell Moment  Wound Classification: Clean Contaminated  Indications: Patient is a 41 y.o. female developed right upper quadrant pain and on workup was found to have cholelithiasis with a normal common duct. Elevated bilirubin but negative MRCP.  Robotic Assisted Laparoscopic cholecystectomy was elected.  Findings: Chronic pericholecystic inflammation Critical view of safety achieved Cystic duct and artery identified, ligated and divided Adequate hemostasis  Description of procedure: The patient was placed on the operating table in the supine position. General anesthesia was induced. A time-out was completed verifying correct patient, procedure, site, positioning, and implant(s) and/or special equipment prior to beginning this procedure. An orogastric tube was placed. The abdomen was prepped and draped in the usual sterile fashion.  An incision was made in a natural skin line below the umbilicus.  The fascia was elevated and the Veress needle inserted. Proper position was confirmed by aspiration and saline meniscus test.  The abdomen was insufflated with carbon dioxide to a pressure of 15 mmHg. The patient tolerated insufflation well. A 8-mm trocar was then inserted in optiview fashion.  The laparoscope was inserted and the abdomen inspected. No injuries from initial trocar placement were noted. Additional trocars were then inserted in the following locations: an 8-mm trocar in the left lateral abdomen, and another two 8-mm trocars to the right side of the abdomen 5 cm appart. The umbilical trocar was changed to a 12 mm trocar all under direct visualization. The abdomen was inspected and no abnormalities were found. The table was placed in the reverse Trendelenburg position with the right side up. The robotic arms were  docked and target anatomy identified. Instrument inserted under direct visualization.  Filmy adhesions between the gallbladder and omentum, duodenum and transverse colon were lysed with electrocautery. The dome of the gallbladder was grasped with a prograsp and retracted over the dome of the liver. The infundibulum was also grasped with an atraumatic grasper and retracted toward the right lower quadrant. This maneuver exposed Calot's triangle. The peritoneum overlying the gallbladder infundibulum was then incised and the cystic duct and cystic artery identified and circumferentially dissected. Critical view of safety reviewed before ligating any structure. Firefly images taken to visualize biliary ducts. The cystic duct and cystic artery were then doubly clipped and divided close to the gallbladder.  The gallbladder was then dissected from its peritoneal attachments by electrocautery. Hemostasis was checked and the gallbladder and contained stones were removed using an endoscopic retrieval bag. The gallbladder was passed off the table as a specimen. There was no evidence of bleeding from the gallbladder fossa or cystic artery or leakage of the bile from the cystic duct stump. Secondary trocars were removed under direct vision. No bleeding was noted. The robotic arms were undoked. The scope was withdrawn and the umbilical trocar removed. The abdomen was allowed to collapse. The fascia of the 51mm trocar sites was closed with figure-of-eight 0 vicryl sutures. The skin was closed with subcuticular sutures of 4-0 monocryl and topical skin adhesive. The orogastric tube was removed.  The patient tolerated the procedure well and was taken to the postanesthesia care unit in stable condition.   Specimen: Gallbladder  Complications: None  EBL: 5 mL

## 2022-10-10 NOTE — Anesthesia Procedure Notes (Addendum)
Procedure Name: Intubation Date/Time: 10/10/2022 7:34 AM  Performed by: Hilbert Odor, CRNAPre-anesthesia Checklist: Patient identified, Emergency Drugs available, Suction available, Patient being monitored and Timeout performed Patient Re-evaluated:Patient Re-evaluated prior to induction Oxygen Delivery Method: Circle system utilized Preoxygenation: Pre-oxygenation with 100% oxygen Induction Type: IV induction Ventilation: Mask ventilation without difficulty Laryngoscope Size: 3 and Mac Grade View: Grade I Tube type: Oral Tube size: 7.5 mm Airway Equipment and Method: Stylet Placement Confirmation: ETT inserted through vocal cords under direct vision, positive ETCO2, CO2 detector and breath sounds checked- equal and bilateral Secured at: 20 cm Tube secured with: Tape Dental Injury: Teeth and Oropharynx as per pre-operative assessment

## 2022-10-10 NOTE — Interval H&P Note (Signed)
History and Physical Interval Note:  10/10/2022 6:54 AM  Tammy Lee  has presented today for surgery, with the diagnosis of K80.20 calculus of gallbladder w/o cholecystitis or obstruction.  The various methods of treatment have been discussed with the patient and family. After consideration of risks, benefits and other options for treatment, the patient has consented to  Procedure(s): XI ROBOTIC ASSISTED LAPAROSCOPIC CHOLECYSTECTOMY (N/A) [place] (ICG) (N/A) as a surgical intervention.  The patient's history has been reviewed, patient examined, no change in status, stable for surgery.  I have reviewed the patient's chart and labs.  Questions were answered to the patient's satisfaction.     [name]

## 2022-10-10 NOTE — Anesthesia Postprocedure Evaluation (Signed)
Anesthesia Post Note  Patient: Tammy Lee  Procedure(s) Performed: XI ROBOTIC ASSISTED LAPAROSCOPIC CHOLECYSTECTOMY (Abdomen) INDOCYANINE GREEN FLUORESCENCE IMAGING (ICG)  Patient location during evaluation: PACU Anesthesia Type: General Level of consciousness: awake and alert, oriented and patient cooperative Pain management: pain level controlled Vital Signs Assessment: post-procedure vital signs reviewed and stable Respiratory status: spontaneous breathing, nonlabored ventilation and respiratory function stable Cardiovascular status: blood pressure returned to baseline and stable Postop Assessment: adequate PO intake Anesthetic complications: no   No notable events documented.   Last Vitals:  Vitals:   10/10/22 0855 10/10/22 0900  BP: (!) 144/91 (!) 154/99  Pulse: 84 78  Resp: (!) 23 17  Temp: (!) 36 C (!) 36.1 C  SpO2: 100% 100%    Last Pain:  Vitals:   10/10/22 0900  TempSrc:   PainSc: Asleep                 Darrin Nipper

## 2022-10-10 NOTE — Transfer of Care (Signed)
Immediate Anesthesia Transfer of Care Note  Patient: Tammy Lee  Procedure(s) Performed: XI ROBOTIC ASSISTED LAPAROSCOPIC CHOLECYSTECTOMY (Abdomen) INDOCYANINE GREEN FLUORESCENCE IMAGING (ICG)  Patient Location: PACU  Anesthesia Type:General  Level of Consciousness: alert , oriented and drowsy  Airway & Oxygen Therapy: Patient Spontanous Breathing and Patient connected to nasal cannula oxygen  Post-op Assessment: Report given to RN and Post -op Vital signs reviewed and stable  Post vital signs: Reviewed and stable  Last Vitals:  Vitals Value Taken Time  BP 154/99 10/10/22 0900  Temp 36.1 C 10/10/22 0900  Pulse 90 10/10/22 0901  Resp 24 10/10/22 0901  SpO2 100 % 10/10/22 0901  Vitals shown include unvalidated device data.  Last Pain:  Vitals:   10/10/22 0900  TempSrc:   PainSc: Asleep         Complications: No notable events documented.

## 2022-10-10 NOTE — H&P (Signed)
PATIENT PROFILE: Tammy Lee is a 41 y.o. female who presents to the Clinic for consultation at the request of Dr. Chauncey Mann for evaluation of cholelithiasis.  PCP:  Dion Body, MD  HISTORY OF PRESENT ILLNESS: [name] reports she has been having intermittent abdominal pain.  Pain mostly on the right upper quadrant.  Recently on 10/07/2022 she had a severe abdominal pain and she went to the ED.  She was found with elevated bilirubin.  She had MRCP with negative findings of choledocholithiasis.  She does have sludge seen on the MRCP.  She also had an ultrasound on February of this year with sludge findings.  There has been no sign of cholecystitis.  She endorses that the pain that she had when she went to the ED has resolved.  The pain was mostly on the right upper quadrant.  Pain radiated to her back.  Pain sometimes radiates to the upper shoulder.  Patient cannot identify any alleviating or aggravating factors.   PROBLEM LIST: Problem List  Date Reviewed: 11/14/2021          Noted   Other neutropenia (WBC 3.6 - 11/07/21) - previously followed by Dr. Grayland Ormond 01/12/2020   Class 1 obesity due to excess calories without serious comorbidity with body mass index (BMI) of 30.0 to 30.9 in adult 07/11/2019   Vitamin D deficiency, unspecified (Vit D 35.4 - 11/06/20) 04/07/2016   Status post vaginal hysterectomy 03/25/2016   GERD (gastroesophageal reflux disease) 10/18/2015   Endometriosis 10/18/2015   Ovarian cyst Unknown   History of ectopic pregnancy Unknown   Anxiety - stable without meds Unknown    GENERAL REVIEW OF SYSTEMS:   General ROS: negative for - chills, fatigue, fever, weight gain or weight loss Allergy and Immunology ROS: negative for - hives  Hematological and Lymphatic ROS: negative for - bleeding problems or bruising, negative for palpable nodes Endocrine ROS: negative for - heat or cold intolerance, hair changes Respiratory ROS: negative for - cough, shortness of  breath or wheezing Cardiovascular ROS: no chest pain or palpitations GI ROS: negative for nausea, vomiting, diarrhea, constipation.  Positive for abdominal pain Musculoskeletal ROS: negative for - joint swelling or muscle pain Neurological ROS: negative for - confusion, syncope Dermatological ROS: negative for pruritus and rash Psychiatric: negative for anxiety, depression, difficulty sleeping and memory loss  MEDICATIONS: Current Outpatient Medications  Medication Sig Dispense Refill   ascorbic acid, vitamin C, (VITAMIN C) 1000 MG tablet Take 1 tablet (1,000 mg total) by mouth once daily     calcium carbonate-vitamin D3 (CALTRATE 600+D) 600 mg(1,555m) -400 unit tablet Take 1 tablet by mouth 2 (two) times daily with meals     cholecalciferol (VITAMIN D3) 1000 unit tablet Take 1,000 Units by mouth once daily     fluticasone propionate (FLONASE) 50 mcg/actuation nasal spray Place 1 spray into both nostrils 2 (two) times daily 16 g 0   omeprazole (PRILOSEC) 20 MG DR capsule Take 1 capsule (20 mg total) by mouth once daily 90 capsule 3   valACYclovir (VALTREX) 1000 MG tablet Take 1 tablet (1,000 mg total) by mouth once daily X 5 days as needed for outbreak 90 tablet 1   zinc 50 mg Tab Take by mouth once daily     ferrous sulfate, dried (IRON) 159 mg (45 mg iron) ER tablet Take 45 mg by mouth daily with breakfast (Patient not taking: Reported on 01/17/2022)     No current facility-administered medications for this visit.  ALLERGIES: Citalopram, Topiramate, and Augmentin [amoxicillin-pot clavulanate]  PAST MEDICAL HISTORY: Past Medical History:  Diagnosis Date   Allergic state    seasonal   Anxiety    GERD (gastroesophageal reflux disease)    History of ectopic pregnancy    Neuropathy    Ovarian cyst    Pure hypercholesterolemia (LDL 137 - 07/04/19) - diet controlled 07/11/2019   Vestibular migraine 05/26/2015    PAST SURGICAL HISTORY: Past Surgical History:  Procedure Laterality  Date   ACNE CYST REMOVAL     on neck   HYSTERECTOMY     TUBAL LIGATION       FAMILY HISTORY: Family History  Problem Relation Age of Onset   Anxiety Mother    Diabetes type II Maternal Grandmother    Diabetes Maternal Grandmother    High blood pressure (Hypertension) Maternal Grandmother    Stroke Maternal Grandmother    Heart disease Maternal Grandfather    Myocardial Infarction (Heart attack) Maternal Grandfather    High blood pressure (Hypertension) Maternal Grandfather    Kidney disease Daughter    ALS Maternal Aunt    Kidney disease Daughter      SOCIAL HISTORY: Social History   Socioeconomic History   Marital status: Single  Tobacco Use   Smoking status: Never   Smokeless tobacco: Never  Vaping Use   Vaping Use: Never used  Substance and Sexual Activity   Alcohol use: Yes    Alcohol/week: 0.0 standard drinks of alcohol    Comment: occasionally   Drug use: No   Sexual activity: Yes    Partners: Male    Birth control/protection: Surgical  Social History Narrative   Education: HS   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 Weight: 75.8 kg (167 lb)   GENERAL: Alert, active, oriented x3  HEENT: Pupils equal reactive to light. Extraocular movements are intact. Sclera clear. Palpebral conjunctiva normal red color.Pharynx clear.  NECK: Supple with no palpable mass and no adenopathy.  LUNGS: Sound clear with no rales rhonchi or wheezes.  HEART: Regular rhythm S1 and S2 without murmur.  ABDOMEN: Soft and depressible, nontender with no palpable mass, no hepatomegaly.   EXTREMITIES: Well-developed well-nourished symmetrical with no dependent edema.  NEUROLOGICAL: Awake alert oriented, facial expression symmetrical, moving all extremities.  REVIEW OF DATA: I have reviewed the following data today: No visits with  results within 3 Month(s) from this visit.  Latest known visit with results is:  Appointment on 11/07/2021  Component Date Value   Hemoglobin A1C 11/07/2021 5.0    Average Blood Glucose (C* 11/07/2021 97    Glucose 11/07/2021 91    Sodium 11/07/2021 140    Potassium 11/07/2021 3.8    Chloride 11/07/2021 108    Carbon Dioxide (CO2) 11/07/2021 26.7    Urea Nitrogen (BUN) 11/07/2021 13    Creatinine 11/07/2021 0.8    Glomerular Filtration Ra* 11/07/2021 95    Calcium 11/07/2021 9.1    AST  11/07/2021 10    ALT  11/07/2021 22    Alk Phos (alkaline Phosp* 11/07/2021 63    Albumin 11/07/2021 4.1    Bilirubin, Total 11/07/2021 0.5    Protein, Total 11/07/2021 7.3    A/G Ratio 11/07/2021 1.3    WBC (White Blood Cell Co* 11/07/2021 3.6 (L)    RBC (Red Blood Cell  Coun* 11/07/2021 3.85 (L)    Hemoglobin 11/07/2021 12.3    Hematocrit 11/07/2021 35.8    MCV (Mean Corpuscular Vo* 11/07/2021 93.0    MCH (Mean Corpuscular He* 11/07/2021 31.9 (H)    MCHC (Mean Corpuscular H* 11/07/2021 34.4    Platelet Count 11/07/2021 184    RDW-CV (Red Cell Distrib* 11/07/2021 11.9    MPV (Mean Platelet Volum* 11/07/2021 11.1    Neutrophils 11/07/2021 1.14 (L)    Lymphocytes 11/07/2021 1.86    Monocytes 11/07/2021 0.41    Eosinophils 11/07/2021 0.11    Basophils 11/07/2021 0.03    Neutrophil % 11/07/2021 32.1    Lymphocyte % 11/07/2021 52.2 (H)    Monocyte % 11/07/2021 11.5    Eosinophil % 11/07/2021 3.1    Basophil% 11/07/2021 0.8    Immature Granulocyte % 11/07/2021 0.3    Immature Granulocyte Cou* 11/07/2021 0.01    Cholesterol, Total 11/07/2021 194    Triglyceride 11/07/2021 95    HDL (High Density Lipopr* 11/07/2021 45.4    LDL Calculated 11/07/2021 130    VLDL Cholesterol 11/07/2021 19    Cholesterol/HDL Ratio 11/07/2021 4.3      ASSESSMENT: Ms. Chopra is a 41 y.o. female presenting for consultation for cholelithiasis.    Patient was oriented about the diagnosis of cholelithiasis. Also  oriented about what is the gallbladder, its anatomy and function and the implications of having stones. The patient was oriented about the treatment alternatives (observation vs cholecystectomy). Patient was oriented that a low percentage of patient will continue to have similar pain symptoms even after the gallbladder is removed. Surgical technique (open vs laparoscopic) was discussed. It was also discussed the goals of the surgery (decrease the pain episodes and avoid the risk of cholecystitis) and the risk of surgery including: bleeding, infection, common bile duct injury, stone retention, injury to other organs such as bowel, liver, stomach, other complications such as hernia, bowel obstruction among others. Also discussed with patient about anesthesia and its complications such as: reaction to medications, pneumonia, heart complications, death, among others.   Patient oriented that most likely she passed a stone when she went to the ED with elevated bilirubin.  MRCP negative for choledocholithiasis.  Discussed recommendation to proceed with cholecystectomy to prevent recurrent episode of choledocholithiasis.  Calculus of gallbladder without cholecystitis without obstruction [K80.20]  PLAN: 1.  Robotic assisted laparoscopic cholecystectomy (74081) 2.  CBC, CMP (done) 3.  Do not take aspirin 5 days before the procedure 4.  Contact us if has any question or concern.   Patient verbalized understanding, all questions were answered, and were agreeable with the plan outlined above.     [name], MD  Electronically signed by [name], MD

## 2022-10-10 NOTE — H&P (View-Only) (Signed)
PATIENT PROFILE: Tammy Lee is a 41 y.o. female who presents to the Clinic for consultation at the request of Dr. Chauncey Mann for evaluation of cholelithiasis.  PCP:  Dion Body, MD  HISTORY OF PRESENT ILLNESS: Tammy Lee reports she has been having intermittent abdominal pain.  Pain mostly on the right upper quadrant.  Recently on 10/07/2022 she had a severe abdominal pain and she went to the ED.  She was found with elevated bilirubin.  She had MRCP with negative findings of choledocholithiasis.  She does have sludge seen on the MRCP.  She also had an ultrasound on February of this year with sludge findings.  There has been no sign of cholecystitis.  She endorses that the pain that she had when she went to the ED has resolved.  The pain was mostly on the right upper quadrant.  Pain radiated to her back.  Pain sometimes radiates to the upper shoulder.  Patient cannot identify any alleviating or aggravating factors.   PROBLEM LIST: Problem List  Date Reviewed: 11/14/2021          Noted   Other neutropenia (WBC 3.6 - 11/07/21) - previously followed by Dr. Grayland Ormond 01/12/2020   Class 1 obesity due to excess calories without serious comorbidity with body mass index (BMI) of 30.0 to 30.9 in adult 07/11/2019   Vitamin D deficiency, unspecified (Vit D 35.4 - 11/06/20) 04/07/2016   Status post vaginal hysterectomy 03/25/2016   GERD (gastroesophageal reflux disease) 10/18/2015   Endometriosis 10/18/2015   Ovarian cyst Unknown   History of ectopic pregnancy Unknown   Anxiety - stable without meds Unknown    GENERAL REVIEW OF SYSTEMS:   General ROS: negative for - chills, fatigue, fever, weight gain or weight loss Allergy and Immunology ROS: negative for - hives  Hematological and Lymphatic ROS: negative for - bleeding problems or bruising, negative for palpable nodes Endocrine ROS: negative for - heat or cold intolerance, hair changes Respiratory ROS: negative for - cough, shortness of  breath or wheezing Cardiovascular ROS: no chest pain or palpitations GI ROS: negative for nausea, vomiting, diarrhea, constipation.  Positive for abdominal pain Musculoskeletal ROS: negative for - joint swelling or muscle pain Neurological ROS: negative for - confusion, syncope Dermatological ROS: negative for pruritus and rash Psychiatric: negative for anxiety, depression, difficulty sleeping and memory loss  MEDICATIONS: Current Outpatient Medications  Medication Sig Dispense Refill   ascorbic acid, vitamin C, (VITAMIN C) 1000 MG tablet Take 1 tablet (1,000 mg total) by mouth once daily     calcium carbonate-vitamin D3 (CALTRATE 600+D) 600 mg(1,555m) -400 unit tablet Take 1 tablet by mouth 2 (two) times daily with meals     cholecalciferol (VITAMIN D3) 1000 unit tablet Take 1,000 Units by mouth once daily     fluticasone propionate (FLONASE) 50 mcg/actuation nasal spray Place 1 spray into both nostrils 2 (two) times daily 16 g 0   omeprazole (PRILOSEC) 20 MG DR capsule Take 1 capsule (20 mg total) by mouth once daily 90 capsule 3   valACYclovir (VALTREX) 1000 MG tablet Take 1 tablet (1,000 mg total) by mouth once daily X 5 days as needed for outbreak 90 tablet 1   zinc 50 mg Tab Take by mouth once daily     ferrous sulfate, dried (IRON) 159 mg (45 mg iron) ER tablet Take 45 mg by mouth daily with breakfast (Patient not taking: Reported on 01/17/2022)     No current facility-administered medications for this visit.  ALLERGIES: Citalopram, Topiramate, and Augmentin [amoxicillin-pot clavulanate]  PAST MEDICAL HISTORY: Past Medical History:  Diagnosis Date   Allergic state    seasonal   Anxiety    GERD (gastroesophageal reflux disease)    History of ectopic pregnancy    Neuropathy    Ovarian cyst    Pure hypercholesterolemia (LDL 137 - 07/04/19) - diet controlled 07/11/2019   Vestibular migraine 05/26/2015    PAST SURGICAL HISTORY: Past Surgical History:  Procedure Laterality  Date   ACNE CYST REMOVAL     on neck   HYSTERECTOMY     TUBAL LIGATION       FAMILY HISTORY: Family History  Problem Relation Age of Onset   Anxiety Mother    Diabetes type II Maternal Grandmother    Diabetes Maternal Grandmother    High blood pressure (Hypertension) Maternal Grandmother    Stroke Maternal Grandmother    Heart disease Maternal Grandfather    Myocardial Infarction (Heart attack) Maternal Grandfather    High blood pressure (Hypertension) Maternal Grandfather    Kidney disease Daughter    ALS Maternal Aunt    Kidney disease Daughter      SOCIAL HISTORY: Social History   Socioeconomic History   Marital status: Single  Tobacco Use   Smoking status: Never   Smokeless tobacco: Never  Vaping Use   Vaping Use: Never used  Substance and Sexual Activity   Alcohol use: Yes    Alcohol/week: 0.0 standard drinks of alcohol    Comment: occasionally   Drug use: No   Sexual activity: Yes    Partners: Male    Birth control/protection: Surgical  Social History Narrative   Education: HS   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 Weight: 75.8 kg (167 lb)   GENERAL: Alert, active, oriented x3  HEENT: Pupils equal reactive to light. Extraocular movements are intact. Sclera clear. Palpebral conjunctiva normal red color.Pharynx clear.  NECK: Supple with no palpable mass and no adenopathy.  LUNGS: Sound clear with no rales rhonchi or wheezes.  HEART: Regular rhythm S1 and S2 without murmur.  ABDOMEN: Soft and depressible, nontender with no palpable mass, no hepatomegaly.   EXTREMITIES: Well-developed well-nourished symmetrical with no dependent edema.  NEUROLOGICAL: Awake alert oriented, facial expression symmetrical, moving all extremities.  REVIEW OF DATA: I have reviewed the following data today: No visits with  results within 3 Month(s) from this visit.  Latest known visit with results is:  Appointment on 11/07/2021  Component Date Value   Hemoglobin A1C 11/07/2021 5.0    Average Blood Glucose (C* 11/07/2021 97    Glucose 11/07/2021 91    Sodium 11/07/2021 140    Potassium 11/07/2021 3.8    Chloride 11/07/2021 108    Carbon Dioxide (CO2) 11/07/2021 26.7    Urea Nitrogen (BUN) 11/07/2021 13    Creatinine 11/07/2021 0.8    Glomerular Filtration Ra* 11/07/2021 95    Calcium 11/07/2021 9.1    AST  11/07/2021 10    ALT  11/07/2021 22    Alk Phos (alkaline Phosp* 11/07/2021 63    Albumin 11/07/2021 4.1    Bilirubin, Total 11/07/2021 0.5    Protein, Total 11/07/2021 7.3    A/G Ratio 11/07/2021 1.3    WBC (White Blood Cell Co* 11/07/2021 3.6 (L)    RBC (Red Blood Cell  Coun* 11/07/2021 3.85 (L)    Hemoglobin 11/07/2021 12.3    Hematocrit 11/07/2021 35.8    MCV (Mean Corpuscular Vo* 11/07/2021 93.0    MCH (Mean Corpuscular He* 11/07/2021 31.9 (H)    MCHC (Mean Corpuscular H* 11/07/2021 34.4    Platelet Count 11/07/2021 184    RDW-CV (Red Cell Distrib* 11/07/2021 11.9    MPV (Mean Platelet Volum* 11/07/2021 11.1    Neutrophils 11/07/2021 1.14 (L)    Lymphocytes 11/07/2021 1.86    Monocytes 11/07/2021 0.41    Eosinophils 11/07/2021 0.11    Basophils 11/07/2021 0.03    Neutrophil % 11/07/2021 32.1    Lymphocyte % 11/07/2021 52.2 (H)    Monocyte % 11/07/2021 11.5    Eosinophil % 11/07/2021 3.1    Basophil% 11/07/2021 0.8    Immature Granulocyte % 11/07/2021 0.3    Immature Granulocyte Cou* 11/07/2021 0.01    Cholesterol, Total 11/07/2021 194    Triglyceride 11/07/2021 95    HDL (High Density Lipopr* 11/07/2021 45.4    LDL Calculated 11/07/2021 130    VLDL Cholesterol 11/07/2021 19    Cholesterol/HDL Ratio 11/07/2021 4.3      ASSESSMENT: Ms. Chopra is a 41 y.o. female presenting for consultation for cholelithiasis.    Patient was oriented about the diagnosis of cholelithiasis. Also  oriented about what is the gallbladder, its anatomy and function and the implications of having stones. The patient was oriented about the treatment alternatives (observation vs cholecystectomy). Patient was oriented that a low percentage of patient will continue to have similar pain symptoms even after the gallbladder is removed. Surgical technique (open vs laparoscopic) was discussed. It was also discussed the goals of the surgery (decrease the pain episodes and avoid the risk of cholecystitis) and the risk of surgery including: bleeding, infection, common bile duct injury, stone retention, injury to other organs such as bowel, liver, stomach, other complications such as hernia, bowel obstruction among others. Also discussed with patient about anesthesia and its complications such as: reaction to medications, pneumonia, heart complications, death, among others.   Patient oriented that most likely she passed a stone when she went to the ED with elevated bilirubin.  MRCP negative for choledocholithiasis.  Discussed recommendation to proceed with cholecystectomy to prevent recurrent episode of choledocholithiasis.  Calculus of gallbladder without cholecystitis without obstruction [K80.20]  PLAN: 1.  Robotic assisted laparoscopic cholecystectomy (74081) 2.  CBC, CMP (done) 3.  Do not take aspirin 5 days before the procedure 4.  Contact us if has any question or concern.   Patient verbalized understanding, all questions were answered, and were agreeable with the plan outlined above.     [name], MD  Electronically signed by [name], MD

## 2022-10-10 NOTE — Addendum Note (Signed)
Addendum  created 10/10/22 1020 by Hilbert Odor, CRNA   Flowsheet accepted, Intraprocedure Meds edited

## 2022-10-10 NOTE — Discharge Instructions (Addendum)
  Diet: Resume home heart healthy regular diet.   Activity: No heavy lifting >20 pounds (children, pets, laundry, garbage) or strenuous activity until follow-up, but light activity and walking are encouraged. Do not drive or drink alcohol if taking narcotic pain medications.  Wound care: May shower with soapy water and pat dry (do not rub incisions), but no baths or submerging incision underwater until follow-up. (no swimming)   Medications: Resume all home medications. For mild to moderate pain: acetaminophen (Tylenol) ***or ibuprofen (if no kidney disease). Combining Tylenol with alcohol can substantially increase your risk of causing liver disease. Narcotic pain medications, if prescribed, can be used for severe pain, though may cause nausea, constipation, and drowsiness. Do not combine Tylenol and Norco within a 6 hour period as Norco contains Tylenol. If you do not need the narcotic pain medication, you do not need to fill the prescription.  Call office ([phone]) at any time if any questions, worsening pain, fevers/chills, bleeding, drainage from incision site, or other concerns.   AMBULATORY SURGERY  DISCHARGE INSTRUCTIONS   The drugs that you were given will stay in your system until tomorrow so for the next 24 hours you should not:  Drive an automobile Make any legal decisions Drink any alcoholic beverage   You may resume regular meals tomorrow.  Today it is better to start with liquids and gradually work up to solid foods.  You may eat anything you prefer, but it is better to start with liquids, then soup and crackers, and gradually work up to solid foods.   Please notify your doctor immediately if you have any unusual bleeding, trouble breathing, redness and pain at the surgery site, drainage, fever, or pain not relieved by medication.    Additional Instructions:        Please contact your physician with any problems or Same Day Surgery at [phone], Monday  through Friday 6 am to 4 pm, or [place] [facility] [place] Main number at [phone].  

## 2022-10-13 LAB — SURGICAL PATHOLOGY

## 2022-12-30 ENCOUNTER — Ambulatory Visit: Payer: Managed Care, Other (non HMO) | Admitting: Urology

## 2022-12-30 ENCOUNTER — Encounter: Payer: Self-pay | Admitting: Urology

## 2022-12-30 VITALS — BP 127/79 | HR 92 | Ht 62.0 in | Wt 167.0 lb

## 2022-12-30 DIAGNOSIS — R350 Frequency of micturition: Secondary | ICD-10-CM

## 2022-12-30 LAB — BLADDER SCAN AMB NON-IMAGING

## 2022-12-30 MED ORDER — OXYBUTYNIN CHLORIDE ER 10 MG PO TB24: 10.0000 mg | ORAL_TABLET | Freq: Every day | ORAL | 1 refills | Status: AC

## 2022-12-30 NOTE — Progress Notes (Signed)
I, Tammy Lee,acting as a scribe for Hollice Espy, MD.,have documented all relevant documentation on the behalf of Hollice Espy, MD,as directed by  Hollice Espy, MD while in the presence of Hollice Espy, MD.   12/30/22 12:29 PM   [place] 06-Nov-1981 [id number]  Referring provider: Frederica Kuster, PA-C [place],  [place] [address]  Chief Complaint  Patient presents with   Establish Care   Urinary Frequency    HPI: 42 year-old female who has had urinary symptoms, here for further evaluation.   She has had numerous urinalysis including cultures all of which are negative. She's been treated presumptively for urinary tract infection although labs don't support this.   She is s/p partial hysterectomy in 2013.  She states she has had issues since 2013. She previosuly saw a urology on [address] where she had a cysto done which reportedly was normal per her recollection.  She then went to her OBGYN Dr. Enzo Bi where she was diagnosed with endometriosis.  She ultimately ended up having a partial hysterectomy which included her uterus and cervix although her ovaries were left in place.  At the time, she reports that that she was told that it may have involved her bladder.  Occasionally she feels frequency & cramping which resolves with Tylenol she attributes to her residual endometriosis.  She notes that she used a sex toy before Thanksgiving which she developed tenderness and pain to the clitoris. She reports the day of Thanksgiving she started noticing UTI symptoms which includes, frequency, vaginal pain, lower back pain where she was given Macrobid but continued to have symptoms.   Today she denies vaginal irritation, drainage, itching currently. She states that when holding her urination for a long period of time she develops pain. She denies incontinence and constipation. She has never tried any OAB medications.   She was started on a low  dose birth control for endometriosis, managed by OBGYN.  She recently only started this less than a month ago and cannot see a difference in her symptoms.  This was for the purpose of control of her endometriosis.   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   Mastodynia    Status post vaginal hysterectomy     Surgical History: Past Surgical History:  Procedure Laterality Date   BILATERAL TUBAL CATERY     CYST EXCISION     neck   LAPAROSCOPY  10/30/2011   WITH EXCISION AND FULGURATION OF ENDOMETRIOSIS   LIPOSUCTION     back and abdomen arms flanks   operative L/S with Left Salpingectomy  12/30/2003   Ectopic   TVH     02/23/2012    Home Medications:  Allergies as of 12/30/2022       Reactions   Citalopram Other (See Comments)   sleepiness   Topiramate Other (See Comments)   sleepy   Augmentin [amoxicillin-pot Clavulanate] Rash        Medication List        Accurate as  of December 30, 2022 12:29 PM. If you have any questions, ask your nurse or doctor.          D3-1000 25 MCG (1000 UT) capsule Generic drug: Cholecalciferol Take 1,000 Units by mouth daily.   ELDERBERRY PO Take 1 tablet by mouth daily.   omeprazole 20 MG capsule Commonly known as: PRILOSEC Take 20 mg by mouth daily.   oxybutynin 10 MG 24 hr tablet Commonly known as: DITROPAN-XL Take 1 tablet (10 mg total) by mouth daily. Started by: Hollice Espy, MD   vitamin C 250 MG tablet Commonly known as: ASCORBIC ACID Take 250 mg by mouth daily.   Zinc 50 MG Tabs Take 1 tablet by mouth daily.        Allergies:  Allergies  Allergen  Reactions   Citalopram Other (See Comments)    sleepiness   Topiramate Other (See Comments)    sleepy   Augmentin [Amoxicillin-Pot Clavulanate] Rash    Family History: Family History  Problem Relation Age of Onset   Cancer Neg Hx    Diabetes Neg Hx    Heart disease Neg Hx     Social History:  reports that she has never smoked. She has never used smokeless tobacco. She reports current alcohol use. She reports that she does not use drugs.   Physical Exam: BP 127/79   Pulse 92   Ht 5\' 2"  (1.575 m)   Wt 167 lb (75.8 kg)   BMI 30.54 kg/m   Constitutional:  Alert and oriented, No acute distress. HEENT: [place] AT, moist mucus membranes.  Trachea midline, no masses. Neurologic: Grossly intact, no focal deficits, moving all 4 extremities. Psychiatric: Normal mood and affect.  Assessment & Plan:    Pelvic pain/bladder spasm/urgengy/frequency - We'll try Oxybutynin 10 mg daily to treat her urinary symptoms specifically -Agree with treatment for her underlying endometriosis, possibly contributing factor -No indication for cystoscopy at this point time in light of her adequate emptying, negative urinalysis and previous negative cystoscopy, low yield -We discussed possible side effects of the medication including dry eyes, dry mouth and constipation.  She also like to check her formulary to see if beta 3 agonist is covered. -If she derives limited benefit after a few weeks, she will message [place] to increase the dose -Will recheck in 3 months.  Return in about 3 months (around 03/31/2023).  I have reviewed the above documentation for accuracy and completeness, and I agree with the above.   Hollice Espy, MD   [facility] [address] [place], [place] [address] [phone]

## 2023-03-31 ENCOUNTER — Other Ambulatory Visit: Payer: Self-pay | Admitting: Family Medicine

## 2023-03-31 ENCOUNTER — Ambulatory Visit: Payer: Managed Care, Other (non HMO) | Admitting: Urology

## 2023-03-31 DIAGNOSIS — Z1231 Encounter for screening mammogram for malignant neoplasm of breast: Secondary | ICD-10-CM

## 2023-05-18 ENCOUNTER — Ambulatory Visit
Admission: RE | Admit: 2023-05-18 | Discharge: 2023-05-18 | Disposition: A | Discharge status: Home / Self Care | Payer: Managed Care, Other (non HMO) | Source: Ambulatory Visit | Attending: Family Medicine | Admitting: Family Medicine

## 2023-05-18 DIAGNOSIS — Z1231 Encounter for screening mammogram for malignant neoplasm of breast: Secondary | ICD-10-CM | POA: Diagnosis not present

## 2023-08-14 IMAGING — MG MM DIGITAL SCREENING BILAT W/ TOMO AND CAD
8 series · 8 of 24 positions shown · non-contrast
Comparison: Previous exam(s).

CLINICAL DATA: Screening.

EXAM:
DIGITAL SCREENING BILATERAL MAMMOGRAM WITH TOMOSYNTHESIS AND CAD
TECHNIQUE: Bilateral screening digital craniocaudal and mediolateral oblique
mammograms were obtained. Bilateral screening digital breast
tomosynthesis was performed. The images were evaluated with
computer-aided detection.

[L CC synth-2D]
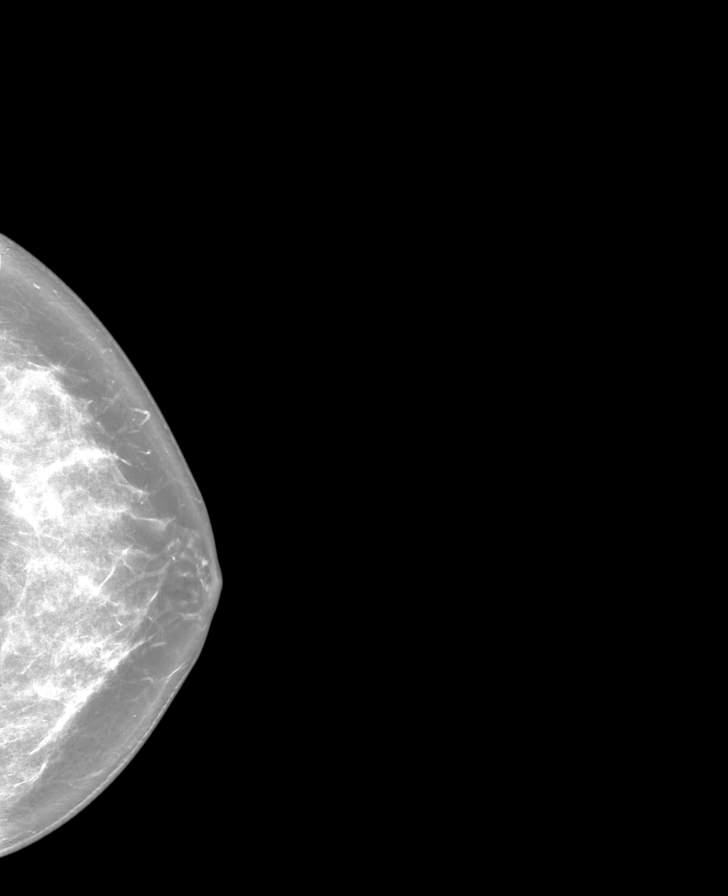

[L MLO synth-2D]
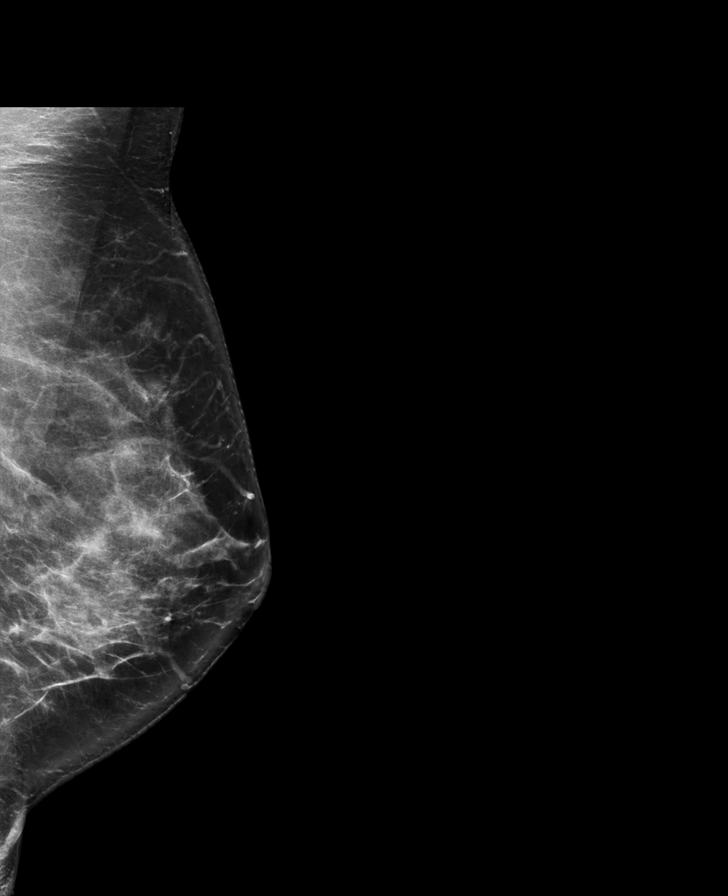

[R CC synth-2D]
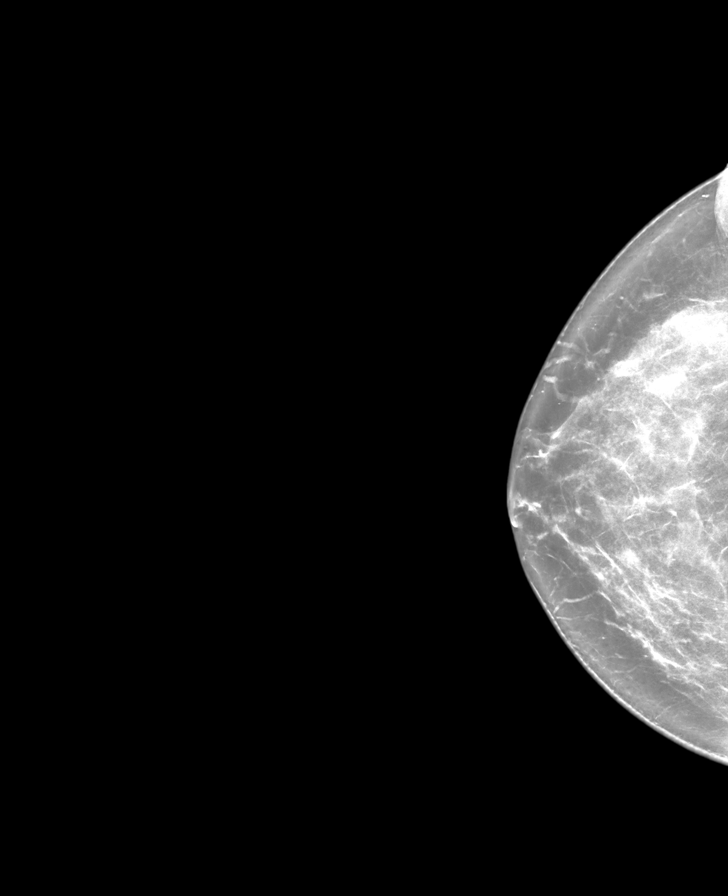

[R MLO synth-2D]
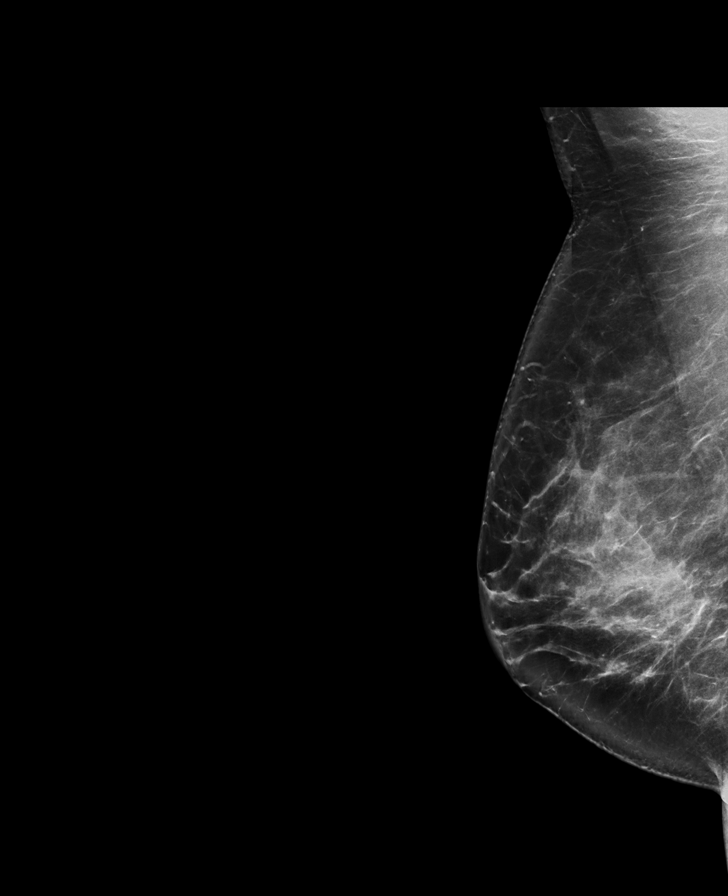

[L MLO tomo · tomo slice 45/89.0]
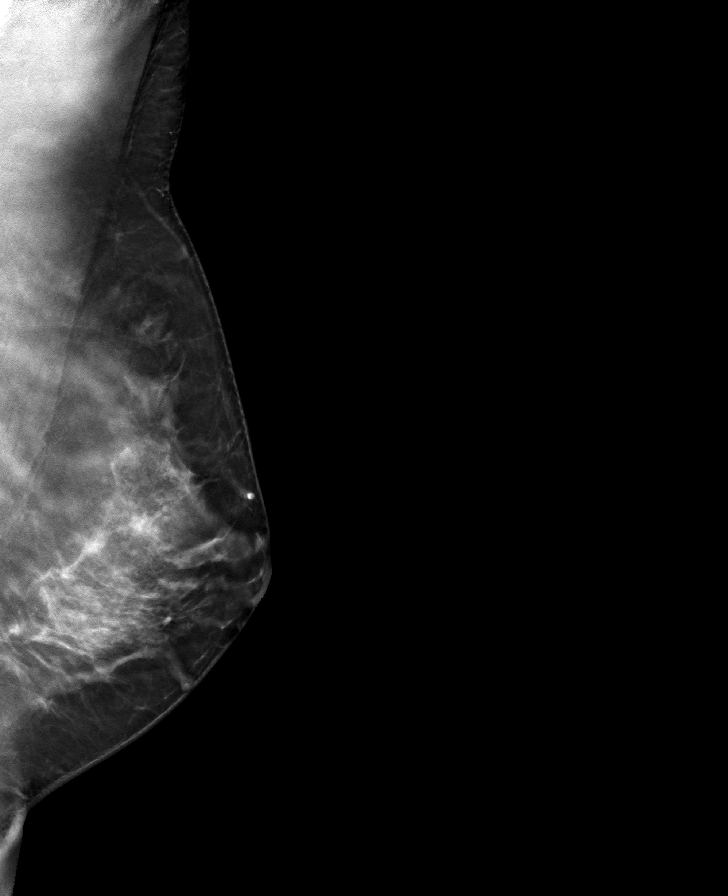

[R MLO tomo · tomo slice 45/90.0]
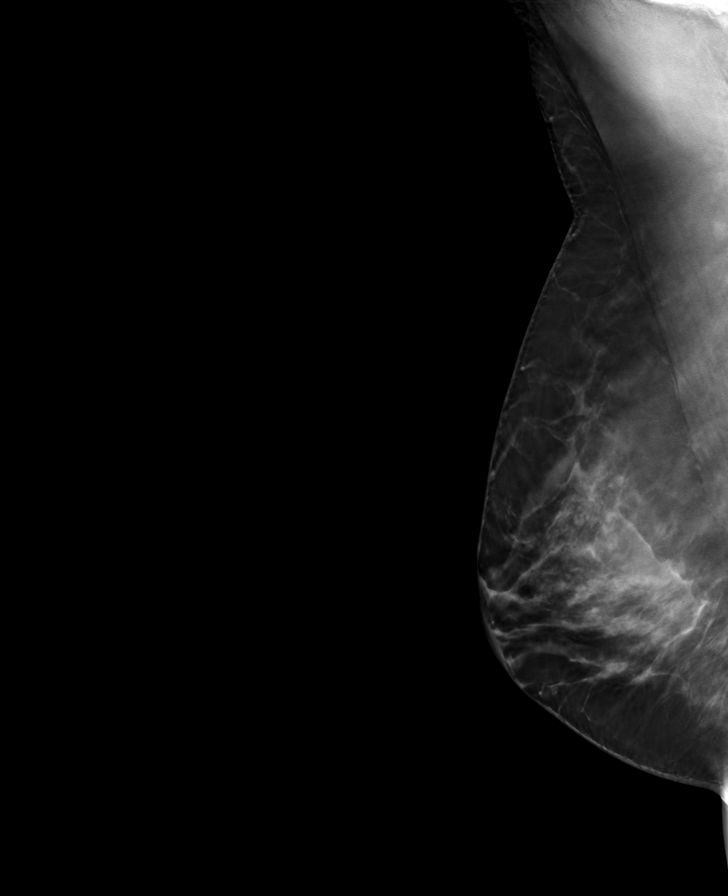

[R CC tomo · tomo slice 39/78.0]
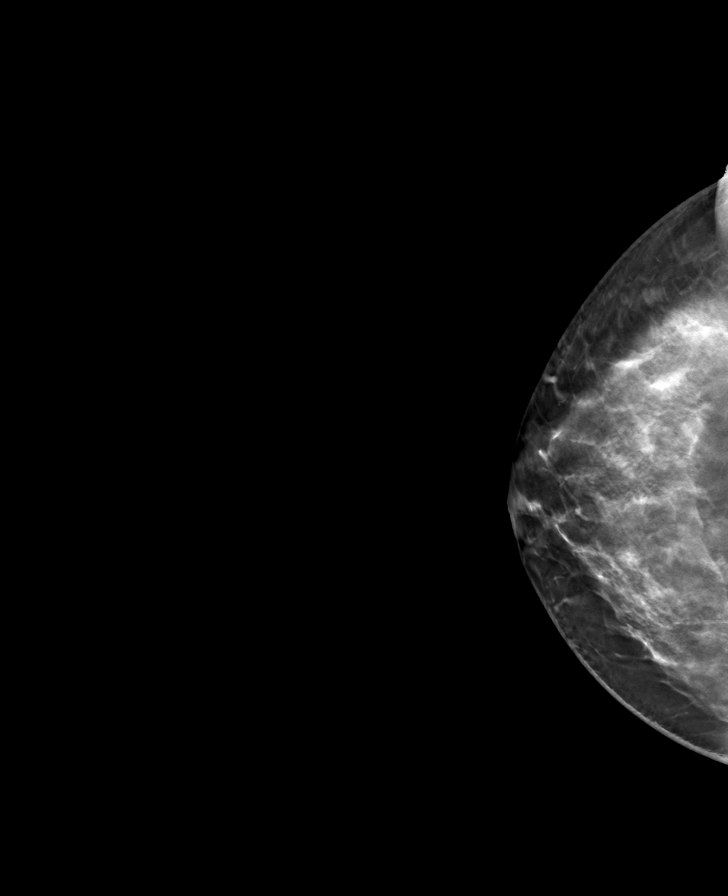

[L CC tomo · tomo slice 43/86.0]
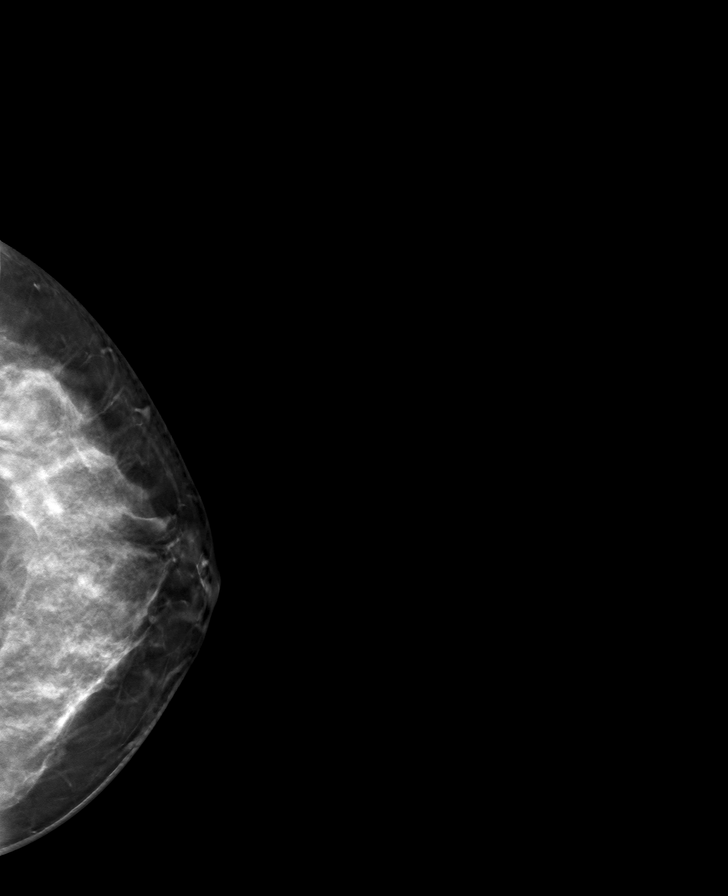

[8 of 24 positions shown; findings below may reference images not displayed]

ACR Breast Density Category d: The breast tissue is extremely dense,
which lowers the sensitivity of mammography
FINDINGS: There are no findings suspicious for malignancy.
IMPRESSION: No mammographic evidence of malignancy. A result letter of this
screening mammogram will be mailed directly to the patient.

RECOMMENDATION:
Screening mammogram in one year. (Code:TA-V-WV9)

BI-RADS CATEGORY  1: Negative.

## 2023-12-18 ENCOUNTER — Other Ambulatory Visit: Payer: Self-pay | Admitting: Obstetrics and Gynecology

## 2023-12-18 DIAGNOSIS — Z1231 Encounter for screening mammogram for malignant neoplasm of breast: Secondary | ICD-10-CM

## 2024-05-18 ENCOUNTER — Ambulatory Visit
Admission: RE | Admit: 2024-05-18 | Discharge: 2024-05-18 | Disposition: A | Discharge status: Home / Self Care | Source: Ambulatory Visit | Attending: Obstetrics and Gynecology | Admitting: Obstetrics and Gynecology

## 2024-05-18 DIAGNOSIS — Z1231 Encounter for screening mammogram for malignant neoplasm of breast: Secondary | ICD-10-CM | POA: Diagnosis present
